# Patient Record
Sex: Female | Born: 1985 | Hispanic: No | Marital: Married | Smoking: Never smoker
Health system: Southern US, Community
[De-identification: ages and names within clinical notes are randomized; demographics above are authoritative.]

## PROBLEM LIST (undated history)

## (undated) DIAGNOSIS — J45909 Unspecified asthma, uncomplicated: Secondary | ICD-10-CM

## (undated) HISTORY — PX: NO PAST SURGERIES: SHX2092

---

## 2015-06-05 ENCOUNTER — Encounter: Payer: Self-pay | Admitting: Gynecology

## 2015-06-05 ENCOUNTER — Ambulatory Visit: Payer: Self-pay

## 2015-06-05 ENCOUNTER — Ambulatory Visit
Admission: EM | Admit: 2015-06-05 | Discharge: 2015-06-05 | Disposition: A | Discharge status: Home / Self Care | Payer: Self-pay | Attending: Family Medicine | Admitting: Family Medicine

## 2015-06-05 DIAGNOSIS — J209 Acute bronchitis, unspecified: Secondary | ICD-10-CM

## 2015-06-05 HISTORY — DX: Unspecified asthma, uncomplicated: J45.909

## 2015-06-05 MED ORDER — IPRATROPIUM-ALBUTEROL 0.5-2.5 (3) MG/3ML IN SOLN
3.0000 mL | Freq: Once | RESPIRATORY_TRACT | Status: AC
  Administered 2015-06-05: 3 mL via RESPIRATORY_TRACT

## 2015-06-05 MED ORDER — METHYLPREDNISOLONE SODIUM SUCC 125 MG IJ SOLR
125.0000 mg | Freq: Once | INTRAMUSCULAR | Status: AC
  Administered 2015-06-05: 125 mg via INTRAMUSCULAR

## 2015-06-05 MED ORDER — PREDNISONE 10 MG (21) PO TBPK: ORAL_TABLET | ORAL | Status: DC

## 2015-06-05 NOTE — Discharge Instructions (Signed)
Acute Bronchitis Bronchitis is when the airways that extend from the windpipe into the lungs get red, puffy, and painful (inflamed). Bronchitis often causes thick spit (mucus) to develop. This leads to a cough. A cough is the most common symptom of bronchitis. In acute bronchitis, the condition usually begins suddenly and goes away over time (usually in 2 weeks). Smoking, allergies, and asthma can make bronchitis worse. Repeated episodes of bronchitis may cause more lung problems. HOME CARE  Rest.  Drink enough fluids to keep your pee (urine) clear or pale yellow (unless you need to limit fluids as told by your doctor).  Only take over-the-counter or prescription medicines as told by your doctor.  Avoid smoking and secondhand smoke. These can make bronchitis worse. If you are a smoker, think about using nicotine gum or skin patches. Quitting smoking will help your lungs heal faster.  Reduce the chance of getting bronchitis again by:  Washing your hands often.  Avoiding people with cold symptoms.  Trying not to touch your hands to your mouth, nose, or eyes.  Follow up with your doctor as told. GET HELP IF: Your symptoms do not improve after 1 week of treatment. Symptoms include:  Cough.  Fever.  Coughing up thick spit.  Body aches.  Chest congestion.  Chills.  Shortness of breath.  Sore throat. GET HELP RIGHT AWAY IF:   You have an increased fever.  You have chills.  You have severe shortness of breath.  You have bloody thick spit (sputum).  You throw up (vomit) often.  You lose too much body fluid (dehydration).  You have a severe headache.  You faint. MAKE SURE YOU:   Understand these instructions.  Will watch your condition.  Will get help right away if you are not doing well or get worse.   This information is not intended to replace advice given to you by your health care provider. Make sure you discuss any questions you have with your health care  provider.   Document Released: 01/31/2008 Document Revised: 04/16/2013 Document Reviewed: 02/04/2013 Elsevier Interactive Patient Education 2016 [employer] Bronchospasm, Adult A bronchospasm is when the tubes that carry air in and out of your lungs (airways) spasm or tighten. During a bronchospasm it is hard to breathe. This is because the airways get smaller. A bronchospasm can be triggered by:  Allergies. These may be to animals, pollen, food, or mold.  Infection. This is a common cause of bronchospasm.  Exercise.  Irritants. These include pollution, cigarette smoke, strong odors, aerosol sprays, and paint fumes.  Weather changes.  Stress.  Being emotional. HOME CARE   Always have a plan for getting help. Know when to call your doctor and local emergency services (911 in the [place] Know where you can get emergency care.  Only take medicines as told by your doctor.  If you were prescribed an inhaler or nebulizer machine, ask your doctor how to use it correctly. Always use a spacer with your inhaler if you were given one.  Stay calm during an attack. Try to relax and breathe more slowly.  Control your home environment:  Change your heating and air conditioning filter at least once a month.  Limit your use of fireplaces and wood stoves.  Do not  smoke. Do not  allow smoking in your home.  Avoid perfumes and fragrances.  Get rid of pests (such as roaches and mice) and their droppings.  Throw away plants if you see mold on them.  Keep  your house clean and dust free.  Replace carpet with wood, tile, or vinyl flooring. Carpet can trap dander and dust.  Use allergy-proof pillows, mattress covers, and box spring covers.  Wash bed sheets and blankets every week in hot water. Dry them in a dryer.  Use blankets that are made of polyester or cotton.  Wash hands frequently. GET HELP IF:  You have muscle aches.  You have chest pain.  The thick spit you spit or  cough up (sputum) changes from clear or white to yellow, green, gray, or bloody.  The thick spit you spit or cough up gets thicker.  There are problems that may be related to the medicine you are given such as:  A rash.  Itching.  Swelling.  Trouble breathing. GET HELP RIGHT AWAY IF:  You feel you cannot breathe or catch your breath.  You cannot stop coughing.  Your treatment is not helping you breathe better.  You have very bad chest pain. MAKE SURE YOU:   Understand these instructions.  Will watch your condition.  Will get help right away if you are not doing well or get worse.   This information is not intended to replace advice given to you by your health care provider. Make sure you discuss any questions you have with your health care provider.   Document Released: 06/11/2009 Document Revised: 09/04/2014 Document Reviewed: 02/04/2013 Elsevier Interactive Patient Education [employer]

## 2015-06-05 NOTE — ED Notes (Signed)
Patient stated that her Proventil that she is taking on and off for 10 yrs for her asthmam is not working. Pt. Stated x 2 weeks ago with  Cold symptom. However when she take her Proventil it does not help.

## 2015-06-05 NOTE — ED Provider Notes (Addendum)
CSN: [id number]     Arrival date & time 06/05/15  0827 History   First MD Initiated Contact with Patient 06/05/15 [phone]     Chief Complaint  Patient presents with  . Asthma   (Consider location/radiation/quality/duration/timing/severity/associated sxs/prior Treatment) Patient is a 29 y.o. female presenting with asthma. The history is provided by the patient.  Asthma This is a recurrent problem. The current episode started more than 1 week ago. The problem has been gradually worsening. Associated symptoms include shortness of breath. Pertinent negatives include no chest pain, no abdominal pain and no headaches. Nothing relieves the symptoms. Treatments tried: Albuterol inhaler.    Past Medical History  Diagnosis Date  . Asthma    History reviewed. No pertinent past surgical history. History reviewed. No pertinent family history. Social History  Substance Use Topics  . Smoking status: Never Smoker   . Smokeless tobacco: None  . Alcohol Use: No   OB History    No data available     Review of Systems  Respiratory: Positive for cough, chest tightness, shortness of breath and wheezing.   Cardiovascular: Negative for chest pain.  Gastrointestinal: Negative for abdominal pain.  Neurological: Negative for headaches.  All other systems reviewed and are negative.   Allergies  Review of patient's allergies indicates no known allergies.  Home Medications   Prior to Admission medications   Medication Sig Start Date End Date Taking? Authorizing Provider  albuterol (PROVENTIL HFA;VENTOLIN HFA) 108 (90 BASE) MCG/ACT inhaler Inhale into the lungs every 6 (six) hours as needed for wheezing or shortness of breath.   Yes Historical Provider, MD  predniSONE (STERAPRED UNI-PAK 21 TAB) 10 MG (21) TBPK tablet Sig 6 tablet day 1, 5 tablets day 2, 4 tablets day 3,,3tablets day 4, 2 tablets day 5, 1 tablet day 6 take all tablets orally 06/05/15   Hassan Rowan, MD   Meds Ordered and Administered this  Visit   Medications  ipratropium-albuterol (DUONEB) 0.5-2.5 (3) MG/3ML nebulizer solution 3 mL (3 mLs Nebulization Given 06/05/15 0926)  methylPREDNISolone sodium succinate (SOLU-MEDROL) 125 mg/2 mL injection 125 mg (125 mg Intramuscular Given 06/05/15 0943)    BP 119/67 mmHg  Pulse 69  Temp(Src) 98.2 F (36.8 C) (Oral)  Resp 18  Ht  (1.549 m)  Wt 189 lb (85.73 kg)  BMI 35.73 kg/m2  SpO2 100%  LMP 05/29/2015 No data found.   Physical Exam  Constitutional: She is oriented to person, place, and time. She appears well-developed and well-nourished.  HENT:  Head: Normocephalic and atraumatic.  Eyes: Pupils are equal, round, and reactive to light.  Neck: Normal range of motion. Neck supple.  Cardiovascular: Normal rate, regular rhythm and normal heart sounds.   Pulmonary/Chest: Effort normal and breath sounds normal. No respiratory distress. She has no wheezes.  Musculoskeletal: Normal range of motion. She exhibits no edema.  Neurological: She is alert and oriented to person, place, and time.  Skin: Skin is warm. She is not diaphoretic.  Psychiatric: She has a normal mood and affect.  Vitals reviewed.   ED Course  Procedures (including critical care time)  Labs Review Labs Reviewed - No data to display  Imaging Review Dg Chest 2 View  06/05/2015   CLINICAL DATA:  Shortness of breath.  EXAM: CHEST  2 VIEW  COMPARISON:  None.  FINDINGS: The heart size and mediastinal contours are within normal limits. Both lungs are clear. No pneumothorax or pleural effusion is noted. The visualized skeletal structures are unremarkable.  IMPRESSION: No active cardiopulmonary disease.   Electronically Signed   By: Lupita Raider, M.D.   On: 06/05/2015 09:58     Visual Acuity Review  Right Eye Distance:   Left Eye Distance:   Bilateral Distance:    Right Eye Near:   Left Eye Near:    Bilateral Near:         MDM   1. Bronchitis, acute, with bronchospasm      Patient given  DuoNeb treatment. One time considered Advair Diskus but she has no insurance. We'll have her continue using Proventil inhaler place on 6 day course of prednisone work note for the next 2 days and return if symptoms not better. At this time she denies ever and cough is not productive hold off on anabiotic's.  After patient was given DuoNeb treatment she still felt short of breath. She is now agreed to have the Solu-Medrol injection and we proceeded with a chest x-ray which was negative. By the time chest x-ray was read patient was feeling better discharge home on oral prednisone for 6 days and the Proventil inhaler she's had before. Workup was also given to her as well and patient was feeling much better.    Hassan Rowan, MD 06/05/15 6045  Hassan Rowan, MD 06/05/15 1142  Hassan Rowan, MD 06/05/15 [phone]

## 2019-03-21 ENCOUNTER — Ambulatory Visit
Admission: EM | Admit: 2019-03-21 | Discharge: 2019-03-21 | Disposition: A | Discharge status: Home / Self Care | Payer: Self-pay | Attending: Family Medicine | Admitting: Family Medicine

## 2019-03-21 ENCOUNTER — Other Ambulatory Visit: Payer: Self-pay

## 2019-03-21 DIAGNOSIS — L255 Unspecified contact dermatitis due to plants, except food: Secondary | ICD-10-CM

## 2019-03-21 MED ORDER — PREDNISONE 20 MG PO TABS: ORAL_TABLET | ORAL | 0 refills | Status: DC

## 2019-03-21 MED ORDER — HYDROCORTISONE 2.5 % EX CREA: TOPICAL_CREAM | Freq: Two times a day (BID) | CUTANEOUS | 0 refills | Status: DC

## 2019-03-21 NOTE — ED Provider Notes (Signed)
[facility]    CSN: [id number] Arrival date & time: 03/21/19  1532     History   Chief Complaint Chief Complaint  Patient presents with  . Rash    HPI Melissa Ballard is a 33 y.o. female.   33 yo female with a c/o rash to her left lower leg which has now spread to her right forearm for the past 4 days. States rash developed after she had been working out in her garden pulling weeds. States she doesn't know for sure if she was exposed to poison ivy or poison oak. States rash is very itchy. Denies any fevers, chills.    Rash   Past Medical History:  Diagnosis Date  . Asthma     There are no active problems to display for this patient.   Past Surgical History:  Procedure Laterality Date  . NO PAST SURGERIES      OB History   No obstetric history on file.      Home Medications    Prior to Admission medications   Medication Sig Start Date End Date Taking? Authorizing Provider  Phentermine HCl 15 MG TBDP Take by mouth.   Yes [provider]  albuterol (PROVENTIL HFA;VENTOLIN HFA) 108 (90 BASE) MCG/ACT inhaler Inhale into the lungs every 6 (six) hours as needed for wheezing or shortness of breath.    [provider]  hydrocortisone 2.5 % cream Apply topically 2 (two) times daily. 03/21/19   Norval Gable, MD  predniSONE (DELTASONE) 20 MG tablet 3 tabs po once day 1, then 2 tabs po qd x 2 days, then 1 tab po qd x 2 days, then half a tab po qd x 2 days 03/21/19   Norval Gable, MD    Family History History reviewed. No pertinent family history.  Social History Social History   Tobacco Use  . Smoking status: Never Smoker  . Smokeless tobacco: Never Used  Substance Use Topics  . Alcohol use: No  . Drug use: No     Allergies   Patient has no known allergies.   Review of Systems Review of Systems  Skin: Positive for rash.     Physical Exam Triage Vital Signs ED Triage Vitals  Enc Vitals Group     BP 03/21/19 1546 (!)  112/53     Pulse Rate 03/21/19 1546 60     Resp 03/21/19 1546 18     Temp 03/21/19 1546 98.7 F (37.1 C)     Temp Source 03/21/19 1546 Oral     SpO2 03/21/19 1546 100 %     Weight 03/21/19 1544 170 lb (77.1 kg)     Height 03/21/19 1544 5\' 2"  (1.575 m)     Head Circumference --      Peak Flow --      Pain Score 03/21/19 1544 0     Pain Loc --      Pain Edu? --      Excl. in [place]? --    No data found.  Updated Vital Signs BP (!) 112/53 (BP Location: Right Arm)   Pulse 60   Temp 98.7 F (37.1 C) (Oral)   Resp 18   Ht 5\' 2"  (1.575 m)   Wt 77.1 kg   LMP 03/17/2019   SpO2 100%   BMI 31.09 kg/m   Visual Acuity Right Eye Distance:   Left Eye Distance:   Bilateral Distance:    Right Eye Near:   Left Eye Near:  Bilateral Near:     Physical Exam Vitals signs and nursing note reviewed.  Constitutional:      General: She is not in acute distress.    Appearance: She is not toxic-appearing or diaphoretic.  Skin:    Findings: Rash (erythematous, vesicular rash on left lower leg; and right forearm ) present.  Neurological:     Mental Status: She is alert.      UC Treatments / Results  Labs (all labs ordered are listed, but only abnormal results are displayed) Labs Reviewed - No data to display  EKG   Radiology No results found.  Procedures Procedures (including critical care time)  Medications Ordered in UC Medications - No data to display  Initial Impression / Assessment and Plan / UC Course  I have reviewed the triage vital signs and the nursing notes.  Pertinent labs & imaging results that were available during my care of the patient were reviewed by me and considered in my medical decision making (see chart for details).      Final Clinical Impressions(s) / UC Diagnoses   Final diagnoses:  Contact dermatitis due to plant    ED Prescriptions    Medication Sig Dispense Auth. Provider   predniSONE (DELTASONE) 20 MG tablet 3 tabs po once day 1,  then 2 tabs po qd x 2 days, then 1 tab po qd x 2 days, then half a tab po qd x 2 days 10 tablet , Pamala Hurryrlando, MD   hydrocortisone 2.5 % cream Apply topically 2 (two) times daily. 30 g Payton Mccallumonty, , MD      1. diagnosis reviewed with patient 2. rx as per orders above; reviewed possible side effects, interactions, risks and benefits  3. Recommend supportive treatment with otc benadryl prn itching 4. Follow-up prn if symptoms worsen or don't improve   Controlled Substance Prescriptions [place] Controlled Substance Registry consulted? Not Applicable   Payton Mccallumonty, , MD 03/21/19 [phone]

## 2019-03-21 NOTE — ED Triage Notes (Signed)
Patient complains of rash to lower right leg and has spread to arms. Patient states that she noticed this on Monday after gardening, reports that this is very itchy.

## 2019-04-09 ENCOUNTER — Other Ambulatory Visit: Payer: Self-pay

## 2019-04-09 DIAGNOSIS — Z20822 Contact with and (suspected) exposure to covid-19: Secondary | ICD-10-CM

## 2019-04-11 LAB — NOVEL CORONAVIRUS, NAA: SARS-CoV-2, NAA: DETECTED — AB

## 2021-02-14 ENCOUNTER — Ambulatory Visit (INDEPENDENT_AMBULATORY_CARE_PROVIDER_SITE_OTHER): Payer: Self-pay

## 2021-02-14 ENCOUNTER — Encounter: Payer: Self-pay | Admitting: Internal Medicine

## 2021-02-14 ENCOUNTER — Ambulatory Visit
Admission: EM | Admit: 2021-02-14 | Discharge: 2021-02-14 | Disposition: A | Discharge status: Home / Self Care | Payer: Self-pay | Attending: Emergency Medicine | Admitting: Emergency Medicine

## 2021-02-14 ENCOUNTER — Other Ambulatory Visit: Payer: Self-pay

## 2021-02-14 DIAGNOSIS — Z20822 Contact with and (suspected) exposure to covid-19: Secondary | ICD-10-CM | POA: Insufficient documentation

## 2021-02-14 DIAGNOSIS — R059 Cough, unspecified: Secondary | ICD-10-CM

## 2021-02-14 DIAGNOSIS — J069 Acute upper respiratory infection, unspecified: Secondary | ICD-10-CM

## 2021-02-14 DIAGNOSIS — R0602 Shortness of breath: Secondary | ICD-10-CM

## 2021-02-14 DIAGNOSIS — J4541 Moderate persistent asthma with (acute) exacerbation: Secondary | ICD-10-CM

## 2021-02-14 LAB — GROUP A STREP BY PCR: Group A Strep by PCR: NOT DETECTED

## 2021-02-14 MED ORDER — DEXAMETHASONE SODIUM PHOSPHATE 10 MG/ML IJ SOLN: 10.0000 mg | Freq: Once | INTRAMUSCULAR | Status: DC

## 2021-02-14 MED ORDER — DEXAMETHASONE SODIUM PHOSPHATE 10 MG/ML IJ SOLN
10.0000 mg | Freq: Once | INTRAMUSCULAR | Status: AC
  Administered 2021-02-14: 10 mg via INTRAMUSCULAR

## 2021-02-14 MED ORDER — PREDNISONE 20 MG PO TABS: ORAL_TABLET | ORAL | 0 refills | Status: DC

## 2021-02-14 MED ORDER — BENZONATATE 200 MG PO CAPS: 200.0000 mg | ORAL_CAPSULE | Freq: Two times a day (BID) | ORAL | 0 refills | Status: DC | PRN

## 2021-02-14 NOTE — ED Provider Notes (Signed)
[facility]    CSN: [id number] Arrival date & time: 02/14/21  1433      History   Chief Complaint Chief Complaint  Patient presents with   Cough    HPI Melissa Ballard is a 35 y.o. female who presents with onset of ST, cough, and nose congestion since yesterday. Had sweats and chills 2 nights ago and felt feverish. Has been feeling SOB. Has been using her inhaler but is not helping.  Denies traveling 8 or more hours by plane or car.     Past Medical History:  Diagnosis Date   Asthma     There are no problems to display for this patient.   Past Surgical History:  Procedure Laterality Date   NO PAST SURGERIES      OB History   No obstetric history on file.      Home Medications    Prior to Admission medications   Medication Sig Start Date End Date Taking? Authorizing Provider  albuterol (PROVENTIL HFA;VENTOLIN HFA) 108 (90 BASE) MCG/ACT inhaler Inhale into the lungs every 6 (six) hours as needed for wheezing or shortness of breath.   Yes [provider]  benzonatate (TESSALON) 200 MG capsule Take 1 capsule (200 mg total) by mouth 2 (two) times daily as needed for cough. 02/14/21  Yes Rodriguez-Southworth, Nettie Elm, PA-C  predniSONE (DELTASONE) 20 MG tablet 3 tabs po once day 1, then 2 tabs po qd x 2 days, then 1 tab po qd x 2 days, then half a tab po qd x 2 days 02/14/21   Rodriguez-Southworth, Nettie Elm, PA-C    Family History History reviewed. No pertinent family history.  Social History Social History   Tobacco Use   Smoking status: Never   Smokeless tobacco: Never  Vaping Use   Vaping Use: Never used  Substance Use Topics   Alcohol use: No   Drug use: No     Allergies   Patient has no known allergies.   Review of Systems Review of Systems  Constitutional:  Positive for chills and diaphoresis. Negative for appetite change and fever.  HENT:  Positive for sore throat. Negative for congestion.   Eyes:  Negative for discharge.   Respiratory:  Positive for cough, chest tightness and shortness of breath.   Gastrointestinal:  Positive for diarrhea. Negative for nausea and vomiting.  Musculoskeletal:  Positive for myalgias.  Skin:  Negative for rash.  Neurological:  Positive for headaches.    Physical Exam Triage Vital Signs ED Triage Vitals [02/14/21 1451]  Enc Vitals Group     BP 127/69     Pulse Rate 75     Resp 18     Temp 98.5 F (36.9 C)     Temp Source Oral     SpO2 100 %     Weight 187 lb (84.8 kg)     Height 5\' 2"  (1.575 m)     Head Circumference      Peak Flow      Pain Score 0     Pain Loc      Pain Edu?      Excl. in [place]?    No data found.  Updated Vital Signs BP 127/69 (BP Location: Right Arm)   Pulse 75   Temp 98.5 F (36.9 C) (Oral)   Resp 18   Ht 5\' 2"  (1.575 m)   Wt 187 lb (84.8 kg)   LMP 01/24/2021   SpO2 100%   BMI 34.20 kg/m  Visual Acuity Right Eye Distance:   Left Eye Distance:   Bilateral Distance:    Right Eye Near:   Left Eye Near:    Bilateral Near:     Physical Exam Physical Exam Constitutional:      General: He is not in acute distress.    Appearance: He is not toxic-appearing.  HENT:     Head: Normocephalic.     Right Ear: Tympanic membrane, ear canal and external ear normal.     Left Ear: Ear canal and external ear normal.     Nose: Nose normal.     Mouth/Throat:     Mouth: Mucous membranes are moist.     Pharynx: Oropharynx is clear.  Eyes:     General: No scleral icterus.    Conjunctiva/sclera: Conjunctivae normal.  Cardiovascular:     Rate and Rhythm: Normal rate and regular rhythm.     Heart sounds: No murmur heard.   Pulmonary:     Effort: Pulmonary effort is normal. No respiratory distress.     Breath sounds: Wheezing present.    Musculoskeletal:        General: Normal range of motion.     Cervical back: Neck supple.  Lymphadenopathy:     Cervical: No cervical adenopathy.  Skin:    General: Skin is warm and dry.     Findings:  No rash.  Neurological:     Mental Status: He is alert and oriented to person, place, and time.     Gait: Gait normal.  Psychiatric:        Mood and Affect: Mood normal.        Behavior: Behavior normal.        Thought Content: Thought content normal.        Judgment: Judgment normal.    UC Treatments / Results  Labs (all labs ordered are listed, but only abnormal results are displayed) Labs Reviewed  GROUP A STREP BY PCR  SARS CORONAVIRUS 2 (TAT 6-24 HRS)  Strep test is neg   EKG   Radiology DG Chest 2 View  Result Date: 02/14/2021 CLINICAL DATA:  Shortness of breath and cough for 1 day. EXAM: CHEST - 2 VIEW COMPARISON:  06/05/2015 FINDINGS: The cardiomediastinal silhouette is unremarkable. There is no evidence of focal airspace disease, pulmonary edema, suspicious pulmonary nodule/mass, pleural effusion, or pneumothorax. No acute bony abnormalities are identified. IMPRESSION: No active cardiopulmonary disease. Electronically Signed   By: Harmon Pier M.D.   On: 02/14/2021 16:51    Procedures Procedures (including critical care time)  Medications Ordered in UC Medications  dexamethasone (DECADRON) injection 10 mg (10 mg Intramuscular Given 02/14/21 1558)    Initial Impression / Assessment and Plan / UC Course  I have reviewed the triage vital signs and the nursing notes. Pertinent labs & imaging results that were available during my care of the patient were reviewed by me and considered in my medical decision making (see chart for details). Covid test is pending. Has URI with Asthma exacerbation. She was given Decadrol 10 mg IM and she did not feel it helped her much.  Her CXR is normal. I placed her on Tessalon and prednisone pack. See instructions.   Final Clinical Impressions(s) / UC Diagnoses   Final diagnoses:  Cough  Upper respiratory tract infection, unspecified type  Moderate persistent asthma with acute exacerbation     Discharge Instructions      Stay  quarantined until the covid test comes back.  ED Prescriptions     Medication Sig Dispense Auth. Provider   predniSONE (DELTASONE) 20 MG tablet 3 tabs po once day 1, then 2 tabs po qd x 2 days, then 1 tab po qd x 2 days, then half a tab po qd x 2 days 10 tablet Rodriguez-Southworth, Nettie Elm, PA-C   benzonatate (TESSALON) 200 MG capsule Take 1 capsule (200 mg total) by mouth 2 (two) times daily as needed for cough. 30 capsule Rodriguez-Southworth, Nettie Elm, PA-C      PDMP not reviewed this encounter.   Garey Ham, Cordelia Poche 02/14/21 2043

## 2021-02-14 NOTE — Discharge Instructions (Addendum)
Stay quarantined until the covid test comes back.

## 2021-02-14 NOTE — ED Triage Notes (Signed)
Patient complains of sore throat, cough, shortness of breath and nasal congestion since yesterday.

## 2021-02-15 LAB — SARS CORONAVIRUS 2 (TAT 6-24 HRS): SARS Coronavirus 2: NEGATIVE

## 2021-05-02 ENCOUNTER — Ambulatory Visit
Admission: EM | Admit: 2021-05-02 | Discharge: 2021-05-02 | Disposition: A | Discharge status: Home / Self Care | Payer: Self-pay | Attending: Family Medicine | Admitting: Family Medicine

## 2021-05-02 ENCOUNTER — Other Ambulatory Visit: Payer: Self-pay

## 2021-05-02 ENCOUNTER — Encounter: Payer: Self-pay | Admitting: Emergency Medicine

## 2021-05-02 DIAGNOSIS — H00014 Hordeolum externum left upper eyelid: Secondary | ICD-10-CM

## 2021-05-02 MED ORDER — POLYMYXIN B-TRIMETHOPRIM 10000-0.1 UNIT/ML-% OP SOLN: 1.0000 [drp] | Freq: Four times a day (QID) | OPHTHALMIC | 0 refills | Status: AC

## 2021-05-02 NOTE — ED Provider Notes (Signed)
[facility]    CSN: [id number] Arrival date & time: 05/02/21  1205      History   Chief Complaint Chief Complaint  Patient presents with   Eye Pain    HPI  35 year old female presents with the above complaint.  Patient reports that she had some pain of her left eye approximate 4 days ago.  Separately noted swelling of the left upper eyelid.  No injury.  She does note some crusting/matting in the a.m.  Her discomfort is 5/10 in severity.  No relieving factors.  No other associated symptoms.  Past Medical History:  Diagnosis Date   Asthma    Past Surgical History:  Procedure Laterality Date   NO PAST SURGERIES      OB History   No obstetric history on file.      Home Medications    Prior to Admission medications   Medication Sig Start Date End Date Taking? Authorizing Provider  trimethoprim-polymyxin b (POLYTRIM) ophthalmic solution Place 1 drop into the left eye every 6 (six) hours for 7 days. 05/02/21 05/09/21 Yes ,  G, DO  albuterol (PROVENTIL HFA;VENTOLIN HFA) 108 (90 BASE) MCG/ACT inhaler Inhale into the lungs every 6 (six) hours as needed for wheezing or shortness of breath.    [provider]  phentermine (ADIPEX-P) 37.5 MG tablet Take 37.5 mg by mouth daily. 03/22/21   [provider]    Social History Social History   Tobacco Use   Smoking status: Never   Smokeless tobacco: Never  Vaping Use   Vaping Use: Never used  Substance Use Topics   Alcohol use: No   Drug use: No     Allergies   Patient has no known allergies.   Review of Systems Review of Systems  Constitutional: Negative.   Eyes:        Swelling of the left upper eyelid. Crusting in a.m.   Physical Exam Triage Vital Signs ED Triage Vitals  Enc Vitals Group     BP 05/02/21 1232 129/80     Pulse Rate 05/02/21 1232 76     Resp 05/02/21 1232 18     Temp 05/02/21 1232 98.2 F (36.8 C)     Temp Source 05/02/21 1232 Oral     SpO2 05/02/21 1232  98 %     Weight --      Height --      Head Circumference --      Peak Flow --      Pain Score 05/02/21 1228 5     Pain Loc --      Pain Edu? --      Excl. in GC? --    Updated Vital Signs BP 129/80 (BP Location: Left Arm)   Pulse 76   Temp 98.2 F (36.8 C) (Oral)   Resp 18   LMP 04/18/2021   SpO2 98%   Visual Acuity Right Eye Distance:   Left Eye Distance:   Bilateral Distance:    Right Eye Near:   Left Eye Near:    Bilateral Near:     Physical Exam Vitals and nursing note reviewed.  Constitutional:      General: She is not in acute distress.    Appearance: Normal appearance. She is not ill-appearing.  HENT:     Head: Normocephalic and atraumatic.  Eyes:     General:        Right eye: No discharge.        Left eye: No  discharge.     Conjunctiva/sclera: Conjunctivae normal.     Comments: Swelling of the left upper eyelid.  Erythema of the left upper eyelid as well.  Pulmonary:     Effort: Pulmonary effort is normal. No respiratory distress.  Neurological:     Mental Status: She is alert.  Psychiatric:        Mood and Affect: Mood normal.        Behavior: Behavior normal.   UC Treatments / Results  Labs (all labs ordered are listed, but only abnormal results are displayed) Labs Reviewed - No data to display  EKG   Radiology No results found.  Procedures Procedures (including critical care time)  Medications Ordered in UC Medications - No data to display  Initial Impression / Assessment and Plan / UC Course  I have reviewed the triage vital signs and the nursing notes.  Pertinent labs & imaging results that were available during my care of the patient were reviewed by me and considered in my medical decision making (see chart for details).    35 year old female presents with a stye advised warm compresses.  Polytrim as directed.  Supportive care.  Final Clinical Impressions(s) / UC Diagnoses   Final diagnoses:  Hordeolum externum of left  upper eyelid     Discharge Instructions      Warm compresses.  Medication as prescribed.  Take care  Dr. Adriana Simas    ED Prescriptions     Medication Sig Dispense Auth. Provider   trimethoprim-polymyxin b (POLYTRIM) ophthalmic solution Place 1 drop into the left eye every 6 (six) hours for 7 days. 10 mL Tommie Sams, DO      PDMP not reviewed this encounter.   Tommie Sams, [place] 05/02/21 1252

## 2021-05-02 NOTE — Discharge Instructions (Signed)
Warm compresses. ° °Medication as prescribed. ° °Take care ° °Dr.   °

## 2021-05-02 NOTE — ED Triage Notes (Signed)
Pt presents today with redness/swelling to left upper lid x 4 days. Denies fever or injury.

## 2021-12-03 ENCOUNTER — Encounter: Payer: Self-pay | Admitting: Emergency Medicine

## 2021-12-03 ENCOUNTER — Ambulatory Visit
Admission: EM | Admit: 2021-12-03 | Discharge: 2021-12-03 | Disposition: A | Discharge status: Home / Self Care | Payer: Self-pay | Attending: Emergency Medicine | Admitting: Emergency Medicine

## 2021-12-03 ENCOUNTER — Other Ambulatory Visit: Payer: Self-pay

## 2021-12-03 DIAGNOSIS — N3001 Acute cystitis with hematuria: Secondary | ICD-10-CM | POA: Insufficient documentation

## 2021-12-03 LAB — URINALYSIS, ROUTINE W REFLEX MICROSCOPIC
Bilirubin Urine: NEGATIVE
Glucose, UA: NEGATIVE mg/dL
Ketones, ur: NEGATIVE mg/dL
Nitrite: NEGATIVE
Protein, ur: NEGATIVE mg/dL
Specific Gravity, Urine: 1.015 (ref 1.005–1.030)
pH: 7 (ref 5.0–8.0)

## 2021-12-03 LAB — URINALYSIS, MICROSCOPIC (REFLEX)

## 2021-12-03 MED ORDER — PHENAZOPYRIDINE HCL 97.2 MG PO TABS: 97.0000 mg | ORAL_TABLET | Freq: Three times a day (TID) | ORAL | 0 refills | Status: DC | PRN

## 2021-12-03 MED ORDER — SULFAMETHOXAZOLE-TRIMETHOPRIM 800-160 MG PO TABS: 1.0000 | ORAL_TABLET | Freq: Two times a day (BID) | ORAL | 0 refills | Status: AC

## 2021-12-03 NOTE — Discharge Instructions (Addendum)
-  Start the antibiotic-Bactrim (trimethoprim-sulfamethoxazole) twice daily x7 days. Take with food if you have a sensitive stomach. ?-Azo (phenazopyridine) as needed up to 3x daily for urinary pain ?-Follow-up if symptoms worsen instead of improve- abd pain, back pain, new fevers, etc. ?

## 2021-12-03 NOTE — ED Triage Notes (Signed)
Patient c/o burning when urinating and urinary frequency and right sided lower back pain that started yesterday.  Patient denies fevers but reports chills.  ?

## 2021-12-03 NOTE — ED Provider Notes (Signed)
?[facility] ? ? ? ?CSN: [id number] ?Arrival date & time: 12/03/21  0954 ? ? ?  ? ?History   ?Chief Complaint ?Chief Complaint  ?Patient presents with  ? Dysuria  ? ? ?HPI ?Melissa Ballard is a 36 y.o. female presenting with urinary symptoms for 1 day.  History UTI, states last 1 was greater than 2 years ago.  Describes dysuria, urinary frequency, suprapubic pressure, right flank pain.  Subjective chills, but denies fevers.  Denies gross hematuria, external vaginal irritation, vaginal discharge, vaginal rash or lesion, n/v/d/c. LMP 11/21/21, States she is not pregnant or breastfeeding. ? ? ?HPI ? ?Past Medical History:  ?Diagnosis Date  ? Asthma   ? ? ?There are no problems to display for this patient. ? ? ?Past Surgical History:  ?Procedure Laterality Date  ? NO PAST SURGERIES    ? ? ?OB History   ?No obstetric history on file. ?  ? ? ? ?Home Medications   ? ?Prior to Admission medications   ?Medication Sig Start Date End Date Taking? Authorizing Provider  ?phenazopyridine (PYRIDIUM) 97 MG tablet Take 1 tablet (97 mg total) by mouth 3 (three) times daily as needed for pain. 12/03/21  Yes Rhys MartiniGraham,  E, PA-C  ?sulfamethoxazole-trimethoprim (BACTRIM DS) 800-160 MG tablet Take 1 tablet by mouth 2 (two) times daily for 7 days. 12/03/21 12/10/21 Yes Rhys MartiniGraham,  E, PA-C  ?albuterol (PROVENTIL HFA;VENTOLIN HFA) 108 (90 BASE) MCG/ACT inhaler Inhale into the lungs every 6 (six) hours as needed for wheezing or shortness of breath.    [provider]  ? ? ?Family History ?History reviewed. No pertinent family history. ? ?Social History ?Social History  ? ?Tobacco Use  ? Smoking status: Never  ? Smokeless tobacco: Never  ?Vaping Use  ? Vaping Use: Never used  ?Substance Use Topics  ? Alcohol use: No  ? Drug use: No  ? ? ? ?Allergies   ?Patient has no known allergies. ? ? ?Review of Systems ?Review of Systems  ?Constitutional:  Positive for chills. Negative for appetite change, diaphoresis and fever.   ?Respiratory:  Negative for shortness of breath.   ?Cardiovascular:  Negative for chest pain.  ?Gastrointestinal:  Negative for abdominal pain, blood in stool, constipation, diarrhea, nausea and vomiting.  ?Genitourinary:  Positive for flank pain and frequency. Negative for decreased urine volume, difficulty urinating, dysuria, genital sores, hematuria and urgency.  ?Musculoskeletal:  Negative for back pain.  ?Neurological:  Negative for dizziness, weakness and light-headedness.  ?All other systems reviewed and are negative. ? ? ?Physical Exam ?Triage Vital Signs ?ED Triage Vitals  ?Enc Vitals Group  ?   BP 12/03/21 1010 118/76  ?   Pulse Rate 12/03/21 1010 66  ?   Resp 12/03/21 1010 14  ?   Temp 12/03/21 1010 98.2 ?F (36.8 ?C)  ?   Temp Source 12/03/21 1010 Oral  ?   SpO2 12/03/21 1010 100 %  ?   Weight 12/03/21 1007 190 lb (86.2 kg)  ?   Height 12/03/21 1007 5\' 2"  (1.575 m)  ?   Head Circumference --   ?   Peak Flow --   ?   Pain Score 12/03/21 1007 7  ?   Pain Loc --   ?   Pain Edu? --   ?   Excl. in GC? --   ? ?No data found. ? ?Updated Vital Signs ?BP 118/76 (BP Location: Right Arm)   Pulse 66   Temp 98.2 ?F (36.8 ?C) (Oral)  Resp 14   Ht 5\' 2"  (1.575 m)   Wt 190 lb (86.2 kg)   LMP 11/21/2021 (Approximate)   SpO2 100%   BMI 34.75 kg/m?  ? ?Visual Acuity ?Right Eye Distance:   ?Left Eye Distance:   ?Bilateral Distance:   ? ?Right Eye Near:   ?Left Eye Near:    ?Bilateral Near:    ? ?Physical Exam ?Vitals reviewed.  ?Constitutional:   ?   General: She is not in acute distress. ?   Appearance: Normal appearance. She is not ill-appearing.  ?HENT:  ?   Head: Normocephalic and atraumatic.  ?   Mouth/Throat:  ?   Mouth: Mucous membranes are moist.  ?   Comments: Moist mucous membranes ?Eyes:  ?   Extraocular Movements: Extraocular movements intact.  ?   Pupils: Pupils are equal, round, and reactive to light.  ?Cardiovascular:  ?   Rate and Rhythm: Normal rate and regular rhythm.  ?   Heart sounds: Normal  heart sounds.  ?Pulmonary:  ?   Effort: Pulmonary effort is normal.  ?   Breath sounds: Normal breath sounds. No wheezing, rhonchi or rales.  ?Abdominal:  ?   General: Bowel sounds are normal. There is no distension.  ?   Palpations: Abdomen is soft. There is no mass.  ?   Tenderness: There is abdominal tenderness in the suprapubic area. There is right CVA tenderness. There is no left CVA tenderness, guarding or rebound. Negative signs include Murphy's sign, Rovsing's sign and McBurney's sign.  ?   Comments: Minimal CVAT on exam. Comfortable throughout exam.   ?Skin: ?   General: Skin is warm.  ?   Capillary Refill: Capillary refill takes less than 2 seconds.  ?   Comments: Good skin turgor  ?Neurological:  ?   General: No focal deficit present.  ?   Mental Status: She is alert and oriented to person, place, and time.  ?Psychiatric:     ?   Mood and Affect: Mood normal.     ?   Behavior: Behavior normal.  ? ? ? ?UC Treatments / Results  ?Labs ?(all labs ordered are listed, but only abnormal results are displayed) ?Labs Reviewed  ?URINALYSIS, ROUTINE W REFLEX MICROSCOPIC - Abnormal; Notable for the following components:  ?    Result Value  ? Hgb urine dipstick MODERATE (*)   ? Leukocytes,Ua SMALL (*)   ? All other components within normal limits  ?URINALYSIS, MICROSCOPIC (REFLEX) - Abnormal; Notable for the following components:  ? Bacteria, UA FEW (*)   ? All other components within normal limits  ?URINE CULTURE  ? ? ?EKG ? ? ?Radiology ?No results found. ? ?Procedures ?Procedures (including critical care time) ? ?Medications Ordered in UC ?Medications - No data to display ? ?Initial Impression / Assessment and Plan / UC Course  ?I have reviewed the triage vital signs and the nursing notes. ? ?Pertinent labs & imaging results that were available during my care of the patient were reviewed by me and considered in my medical decision making (see chart for details). ? ?  ? ?This patient is a very pleasant 36 y.o. year  old female presenting with acute cystitis with hematuria. There is mild R CVAT on exam; I do have concern for early pyelo. Afebrile, nontachy, nontoxic appearing. Tolerating fluids and food by mouth. ? ?UA with moderate blood, small leuk. Culture sent.  ? ?Will cover for pyelo with bactrim as below. Azo for symptomatic relief . ? ?  ED return precautions discussed. Patient verbalizes understanding and agreement.  ? ?Coding Level 4 for acute illness with systemic symptoms, and prescription drug management ?  ? ?Final Clinical Impressions(s) / UC Diagnoses  ? ?Final diagnoses:  ?Acute cystitis with hematuria  ? ? ? ?Discharge Instructions   ? ?  ?-Start the antibiotic-Bactrim (trimethoprim-sulfamethoxazole) twice daily x7 days. Take with food if you have a sensitive stomach. ?-Azo (phenazopyridine) as needed up to 3x daily for urinary pain ?-Follow-up if symptoms worsen instead of improve- abd pain, back pain, new fevers, etc. ? ? ?ED Prescriptions   ? ? Medication Sig Dispense Auth. Provider  ? phenazopyridine (PYRIDIUM) 97 MG tablet Take 1 tablet (97 mg total) by mouth 3 (three) times daily as needed for pain. 10 tablet Rhys Martini, PA-C  ? sulfamethoxazole-trimethoprim (BACTRIM DS) 800-160 MG tablet Take 1 tablet by mouth 2 (two) times daily for 7 days. 14 tablet Rhys Martini, PA-C  ? ?  ? ?PDMP not reviewed this encounter. ?  ?Rhys Martini, PA-C ?12/03/21 1053 ? ?

## 2021-12-05 LAB — URINE CULTURE

## 2022-05-01 ENCOUNTER — Ambulatory Visit
Admission: EM | Admit: 2022-05-01 | Discharge: 2022-05-01 | Disposition: A | Discharge status: Home / Self Care | Payer: Self-pay | Attending: Physician Assistant | Admitting: Physician Assistant

## 2022-05-01 ENCOUNTER — Encounter: Payer: Self-pay | Admitting: Physician Assistant

## 2022-05-01 DIAGNOSIS — B353 Tinea pedis: Secondary | ICD-10-CM

## 2022-05-01 DIAGNOSIS — J4531 Mild persistent asthma with (acute) exacerbation: Secondary | ICD-10-CM

## 2022-05-01 MED ORDER — PREDNISONE 10 MG (21) PO TBPK: ORAL_TABLET | ORAL | 0 refills | Status: DC

## 2022-05-01 MED ORDER — IPRATROPIUM-ALBUTEROL 0.5-2.5 (3) MG/3ML IN SOLN
3.0000 mL | Freq: Once | RESPIRATORY_TRACT | Status: AC
  Administered 2022-05-01: 3 mL via RESPIRATORY_TRACT

## 2022-05-01 MED ORDER — METHYLPREDNISOLONE SODIUM SUCC 40 MG IJ SOLR: 80.0000 mg | Freq: Once | INTRAMUSCULAR | Status: DC

## 2022-05-01 MED ORDER — CICLOPIROX OLAMINE 0.77 % EX CREA: TOPICAL_CREAM | Freq: Two times a day (BID) | CUTANEOUS | 0 refills | Status: DC

## 2022-05-01 MED ORDER — METHYLPREDNISOLONE SODIUM SUCC 125 MG IJ SOLR
125.0000 mg | Freq: Once | INTRAMUSCULAR | Status: AC
  Administered 2022-05-01: 125 mg via INTRAMUSCULAR

## 2022-05-01 NOTE — ED Provider Notes (Signed)
[facility]    CSN: [id number] Arrival date & time: 05/01/22  1205      History   Chief Complaint Chief Complaint  Patient presents with   Cough    HPI Melissa Ballard is a 36 y.o. female.   Patient presents today with a 2-week history of cough.  She reports associated shortness of breath and wheezing.  She does have a history of asthma and has been using her albuterol inhaler which has provided only temporary relief of symptoms.  She denies any fever, chest pain, nausea, vomiting, significant congestion.  She does have allergies and has been taking medication as prescribed.  She does report history of hospitalization related to asthma several years ago but did not require intubation.  She denies any recent antibiotic or steroid use.  Denies history of diabetes and has no concern for pregnancy.  She is having difficulty with her daily activities as result of the cough and shortness of breath prompting evaluation today.  In addition, patient reports a month-long history of pruritic rash on her left foot.  She has tried topical antifungal medications with only temporary improvement of symptoms.  This has provided some improvement but she continues to have pruritus and lesion has not completely healed.  She denies any changes to personal hygiene products including soaps or detergents.  Denies any spread of rash.    Past Medical History:  Diagnosis Date   Asthma     There are no problems to display for this patient.   Past Surgical History:  Procedure Laterality Date   NO PAST SURGERIES      OB History   No obstetric history on file.      Home Medications    Prior to Admission medications   Medication Sig Start Date End Date Taking? Authorizing Provider  albuterol (PROVENTIL HFA;VENTOLIN HFA) 108 (90 BASE) MCG/ACT inhaler Inhale into the lungs every 6 (six) hours as needed for wheezing or shortness of breath.   Yes [provider]  ciclopirox (LOPROX)  0.77 % cream Apply topically 2 (two) times daily. 05/01/22  Yes ,  K, PA-C  predniSONE (STERAPRED UNI-PAK 21 TAB) 10 MG (21) TBPK tablet As directed 05/01/22  Yes , Derry Skill, PA-C    Family History History reviewed. No pertinent family history.  Social History Social History   Tobacco Use   Smoking status: Never   Smokeless tobacco: Never  Vaping Use   Vaping Use: Never used  Substance Use Topics   Alcohol use: No   Drug use: No     Allergies   Patient has no known allergies.   Review of Systems Review of Systems  Constitutional:  Positive for activity change. Negative for appetite change, fatigue and fever.  HENT:  Negative for congestion and sore throat.   Respiratory:  Positive for cough, chest tightness and shortness of breath.   Cardiovascular:  Negative for chest pain.  Gastrointestinal:  Negative for abdominal pain, diarrhea, nausea and vomiting.  Skin:  Positive for rash.  Neurological:  Negative for dizziness, light-headedness and headaches.     Physical Exam Triage Vital Signs ED Triage Vitals  Enc Vitals Group     BP 05/01/22 1307 119/81     Pulse Rate 05/01/22 1307 66     Resp 05/01/22 1307 18     Temp 05/01/22 1307 98.3 F (36.8 C)     Temp Source 05/01/22 1307 Oral     SpO2 05/01/22 1307 99 %  Weight 05/01/22 1306 187 lb (84.8 kg)     Height 05/01/22 1306 5\' 1"  (1.549 m)     Head Circumference --      Peak Flow --      Pain Score --      Pain Loc --      Pain Edu? --      Excl. in GC? --    No data found.  Updated Vital Signs BP 119/81 (BP Location: Left Arm)   Pulse 66   Temp 98.3 F (36.8 C) (Oral)   Resp 18   Ht 5\' 1"  (1.549 m)   Wt 187 lb (84.8 kg)   LMP 04/10/2022   SpO2 99%   BMI 35.33 kg/m   Visual Acuity Right Eye Distance:   Left Eye Distance:   Bilateral Distance:    Right Eye Near:   Left Eye Near:    Bilateral Near:     Physical Exam Vitals reviewed.  Constitutional:      General: She is awake.  She is not in acute distress.    Appearance: Normal appearance. She is well-developed. She is not ill-appearing.     Comments: Very pleasant female appears stated age in no acute distress sitting comfortably in exam room  HENT:     Head: Normocephalic and atraumatic.     Right Ear: Tympanic membrane, ear canal and external ear normal. Tympanic membrane is not erythematous or bulging.     Left Ear: Tympanic membrane, ear canal and external ear normal. Tympanic membrane is not erythematous or bulging.     Nose:     Right Sinus: No maxillary sinus tenderness or frontal sinus tenderness.     Left Sinus: No maxillary sinus tenderness or frontal sinus tenderness.     Mouth/Throat:     Pharynx: Uvula midline. No oropharyngeal exudate or posterior oropharyngeal erythema.  Cardiovascular:     Rate and Rhythm: Normal rate and regular rhythm.     Heart sounds: Normal heart sounds, S1 normal and S2 normal. No murmur heard. Pulmonary:     Effort: Pulmonary effort is normal.     Breath sounds: Wheezing present. No rhonchi or rales.     Comments: Scattered wheezing.  Reactive cough with deep breathing Skin:    Findings: Rash present. Rash is scaling.     Comments: Maculopapular rash with scaling noted medial arch left foot.  Psychiatric:        Behavior: Behavior is cooperative.      UC Treatments / Results  Labs (all labs ordered are listed, but only abnormal results are displayed) Labs Reviewed - No data to display  EKG   Radiology No results found.  Procedures Procedures (including critical care time)  Medications Ordered in UC Medications  ipratropium-albuterol (DUONEB) 0.5-2.5 (3) MG/3ML nebulizer solution 3 mL (3 mLs Nebulization Given 05/01/22 1337)  methylPREDNISolone sodium succinate (SOLU-MEDROL) 125 mg/2 mL injection 125 mg (125 mg Intramuscular Given 05/01/22 1333)    Initial Impression / Assessment and Plan / UC Course  I have reviewed the triage vital signs and the nursing  notes.  Pertinent labs & imaging results that were available during my care of the patient were reviewed by me and considered in my medical decision making (see chart for details).     Patient is well-appearing, afebrile, nontoxic, nontachycardic.  No evidence of acute infection on physical exam that would warrant initiation of antibiotics.  Patient has been symptomatic for several weeks so we will defer viral testing  as this would not change management.  Concern for asthma exacerbation given clinical presentation.  Patient was given DuoNeb and 125 Solu-Medrol in clinic with improvement of symptoms.  We will start prednisone taper tomorrow (05/02/2022).  She was instructed to avoid NSAIDs with this medication due to risk of GI bleeding.  Can use Tylenol, Mucinex, Flonase for additional symptom relief.  She is to continue her albuterol inhaler every 4-6 hours as needed.  Unfortunately, she is self-pay so we will defer initiation of maintenance medication for the time being in the hopes that this flare will resolve with steroid use.  Discussed that if her symptoms persist or worsen anyway she is to return for reevaluation.  Strict return precautions given.  Symptoms consistent with tinea pedis.  She has tried multiple over-the-counter medications without improvement of symptoms.  She was prescribed Loprox.  Discussed that she should keep the area clean and apply this twice daily for minimum of 2 weeks.  If she has any persistent or worsening symptoms she is to return for reevaluation.  Final Clinical Impressions(s) / UC Diagnoses   Final diagnoses:  Mild persistent asthma with acute exacerbation  Tinea pedis of left foot     Discharge Instructions      We gave the injection of steroids today.  Starting tomorrow (05/02/2022) start prednisone.  Do not take NSAIDs including aspirin, ibuprofen/Advil, naproxen/Aleve with this medication.  Continue using your albuterol inhaler every 4-6 hours as needed.  If  your symptoms are improving quickly with this medication regimen or if at any point anything worsens you need to go to the emergency room.  I have called in a prescription for athlete's foot medication.  Use this twice daily for minimum of 2 weeks.  Make sure that you are keeping your feet clean and dry.  Wash your socks and hot water and dry them on high heat.  Spray disinfected in your shoes.  If anything worsens return for reevaluation.     ED Prescriptions     Medication Sig Dispense Auth. Provider   ciclopirox (LOPROX) 0.77 % cream Apply topically 2 (two) times daily. 15 g ,  K, PA-C   predniSONE (STERAPRED UNI-PAK 21 TAB) 10 MG (21) TBPK tablet As directed 21 tablet ,  K, PA-C      PDMP not reviewed this encounter.   Jeani Hawking, PA-C 05/01/22 1345

## 2022-05-01 NOTE — ED Triage Notes (Signed)
Pt here with C/O cough for almost 2 weeks, states she is becoming SOB, has been using albuterol inhaler with little to no relief. States it just doesn't feel like normal breathing. Denies any other SX

## 2022-05-01 NOTE — Discharge Instructions (Signed)
We gave the injection of steroids today.  Starting tomorrow (05/02/2022) start prednisone.  Do not take NSAIDs including aspirin, ibuprofen/Advil, naproxen/Aleve with this medication.  Continue using your albuterol inhaler every 4-6 hours as needed.  If your symptoms are improving quickly with this medication regimen or if at any point anything worsens you need to go to the emergency room.  I have called in a prescription for athlete's foot medication.  Use this twice daily for minimum of 2 weeks.  Make sure that you are keeping your feet clean and dry.  Wash your socks and hot water and dry them on high heat.  Spray disinfected in your shoes.  If anything worsens return for reevaluation.

## 2022-06-03 ENCOUNTER — Ambulatory Visit
Admission: EM | Admit: 2022-06-03 | Discharge: 2022-06-03 | Disposition: A | Discharge status: Home / Self Care | Payer: Self-pay | Attending: Family Medicine | Admitting: Family Medicine

## 2022-06-03 ENCOUNTER — Encounter: Payer: Self-pay | Admitting: Emergency Medicine

## 2022-06-03 DIAGNOSIS — B9689 Other specified bacterial agents as the cause of diseases classified elsewhere: Secondary | ICD-10-CM

## 2022-06-03 DIAGNOSIS — B3731 Acute candidiasis of vulva and vagina: Secondary | ICD-10-CM

## 2022-06-03 DIAGNOSIS — N760 Acute vaginitis: Secondary | ICD-10-CM | POA: Insufficient documentation

## 2022-06-03 DIAGNOSIS — N3000 Acute cystitis without hematuria: Secondary | ICD-10-CM

## 2022-06-03 LAB — URINALYSIS, ROUTINE W REFLEX MICROSCOPIC
Bilirubin Urine: NEGATIVE
Glucose, UA: 100 mg/dL — AB
Ketones, ur: NEGATIVE mg/dL
Leukocytes,Ua: NEGATIVE
Nitrite: POSITIVE — AB
Specific Gravity, Urine: <1.005 — ABNORMAL LOW (ref 1.005–1.030)
pH: 5 (ref 5.0–8.0)

## 2022-06-03 LAB — URINALYSIS, MICROSCOPIC (REFLEX)

## 2022-06-03 LAB — WET PREP, GENITAL
Sperm: NONE SEEN
Trich, Wet Prep: NONE SEEN
WBC, Wet Prep HPF POC: >10 — AB (ref <10)

## 2022-06-03 MED ORDER — METRONIDAZOLE 500 MG PO TABS: 500.0000 mg | ORAL_TABLET | Freq: Two times a day (BID) | ORAL | 0 refills | Status: DC

## 2022-06-03 MED ORDER — CEPHALEXIN 500 MG PO CAPS: 500.0000 mg | ORAL_CAPSULE | Freq: Four times a day (QID) | ORAL | 0 refills | Status: DC

## 2022-06-03 MED ORDER — FLUCONAZOLE 150 MG PO TABS: 150.0000 mg | ORAL_TABLET | ORAL | 0 refills | Status: AC

## 2022-06-03 NOTE — ED Provider Notes (Signed)
[facility]    CSN: [id number] Arrival date & time: 06/03/22  1245      History   Chief Complaint Chief Complaint  Patient presents with   Dysuria     HPI HPI Melissa Ballard is a 36 y.o. female.     Dysuria: Pain urinating started 2 days ago after waking up. Patient reports urinary frequency and urgency.  Reports, no blood in urine. She has tried AZO for this with some relief. Reports antibiotics in the last 30 days. Has had 2 UTIs in the last 12 months. Patient reports  exposure to an STI. Patient does not think she is  pregnant as she has a copper IUD placed in 2018. She does not use condoms regularly with her partner.   Patient reports no CVA tenderness, n fevers, no vaginal discharge, or mouth ulcers.   - Missed period:  Patient's last menstrual period was 05/09/2022 (approximate). - Fever: no - Abdominal pain lower  - Pelvic pain: yes  - Vaginal bleeding: no - Pain during sex: no - Rash: no - Sore throat: no   - Arthralgias: no - Nausea: no - Vomiting: no - Dysuria: yes  - Back Pain: no  - Headache: no       Past Medical History:  Diagnosis Date   Asthma     There are no problems to display for this patient.   Past Surgical History:  Procedure Laterality Date   NO PAST SURGERIES      OB History   No obstetric history on file.      Home Medications    Prior to Admission medications   Medication Sig Start Date End Date Taking? Authorizing Provider  cephALEXin (KEFLEX) 500 MG capsule Take 1 capsule (500 mg total) by mouth 4 (four) times daily. 06/03/22  Yes , , DO  fluconazole (DIFLUCAN) 150 MG tablet Take 1 tablet (150 mg total) by mouth every 3 (three) days for 2 doses. 06/06/22 06/10/22 Yes , , DO  metroNIDAZOLE (FLAGYL) 500 MG tablet Take 1 tablet (500 mg total) by mouth 2 (two) times daily. 06/03/22  Yes , , DO  albuterol (PROVENTIL HFA;VENTOLIN HFA) 108 (90 BASE) MCG/ACT inhaler Inhale  into the lungs every 6 (six) hours as needed for wheezing or shortness of breath.    [provider]  ciclopirox (LOPROX) 0.77 % cream Apply topically 2 (two) times daily. 05/01/22   Raspet, Derry Skill, PA-C    Family History History reviewed. No pertinent family history.  Social History Social History   Tobacco Use   Smoking status: Never   Smokeless tobacco: Never  Vaping Use   Vaping Use: Never used  Substance Use Topics   Alcohol use: No   Drug use: No     Allergies   Patient has no known allergies.   Review of Systems Review of Systems: negative unless otherwise stated in HPI.      Physical Exam Triage Vital Signs ED Triage Vitals  Enc Vitals Group     BP 06/03/22 1253 120/82     Pulse Rate 06/03/22 1253 68     Resp 06/03/22 1253 14     Temp 06/03/22 1253 98.3 F (36.8 C)     Temp Source 06/03/22 1253 Oral     SpO2 06/03/22 1253 96 %     Weight 06/03/22 1251 186 lb 15.2 oz (84.8 kg)     Height 06/03/22 1251 5\' 1"  (1.549 m)     Head Circumference --  Peak Flow --      Pain Score 06/03/22 1250 8     Pain Loc --      Pain Edu? --      Excl. in [place]? --    No data found.  Updated Vital Signs BP 120/82 (BP Location: Left Arm)   Pulse 68   Temp 98.3 F (36.8 C) (Oral)   Resp 14   Ht 5\' 1"  (1.549 m)   Wt 84.8 kg   LMP 05/09/2022 (Approximate)   SpO2 96%   BMI 35.32 kg/m   Visual Acuity Right Eye Distance:   Left Eye Distance:   Bilateral Distance:    Right Eye Near:   Left Eye Near:    Bilateral Near:     Physical Exam GEN: well appearing female in no acute distress  CVS: well perfused  RESP: speaking in full sentences without pause  ABD: soft, suprapubic tenderness, non-distended, no palpable masses, no CVA tenderness   GU: deferred, patient performed self swab    UC Treatments / Results  Labs (all labs ordered are listed, but only abnormal results are displayed) Labs Reviewed  WET PREP, GENITAL - Abnormal; Notable for the  following components:      Result Value   Yeast Wet Prep HPF POC PRESENT (*)    Clue Cells Wet Prep HPF POC PRESENT (*)    WBC, Wet Prep HPF POC >10 (*)    All other components within normal limits  URINALYSIS, ROUTINE W REFLEX MICROSCOPIC - Abnormal; Notable for the following components:   Color, Urine AMBER (*)    Specific Gravity, Urine <1.005 (*)    Glucose, UA 100 (*)    Hgb urine dipstick TRACE (*)    Protein, ur TRACE (*)    Nitrite POSITIVE (*)    All other components within normal limits  URINALYSIS, MICROSCOPIC (REFLEX) - Abnormal; Notable for the following components:   Bacteria, UA MANY (*)    All other components within normal limits  URINE CULTURE    EKG   Radiology No results found.  Procedures Procedures (including critical care time)  Medications Ordered in UC Medications - No data to display  Initial Impression / Assessment and Plan / UC Course  I have reviewed the triage vital signs and the nursing notes.  Pertinent labs & imaging results that were available during my care of the patient were reviewed by me and considered in my medical decision making (see chart for details).     Patient is a 36 y.o. female  who presents for 2 days of dysuria and vaginal discharge. Overall patient is well-appearing and afebrile.  Vital signs stable.  UA consistent with acute cystitis.  Hematuria not  supported on microscopy.  Patient has 2 UTIs in the past year. Treat with  Keflex 4 times daily for 5 days.   Urine culture obtained.  Follow-up sensitivities and change antibiotics, if needed.   Wet prep consistent with both bacterial vaginosis and yeast vaginitis. Treat with Flagyl 500 BID x 7 days and Diflucan in staggered doses.  Advised patient to not drink alcohol while taking Flagyl. She declined STI testing. Return precautions including abdominal pain, fever, chills, nausea, or vomiting given.   Discussed MDM, treatment plan and plan for follow-up with patient/parent  who agrees with plan.    Final Clinical Impressions(s) / UC Diagnoses   Final diagnoses:  Acute cystitis without hematuria  Bacterial vaginosis  Yeast vaginitis     Discharge Instructions  Stop by the pharmacy to pick up your prescriptions.  Follow up with your primary care provider as needed.  For yeast: start taking on Tuesday then take the second dose 3 days later   For bacterial vaginosis: take twice a day for 7 days   For urinary tract infection (UTI): take antibiotics 4 times a day. Someone will call you if we need to change your antibiotics   Go to ED for red flag symptoms, including; fevers you cannot reduce with Tylenol/Motrin, severe headaches, vision changes, numbness/weakness in part of the body, lethargy, confusion, intractable vomiting, severe dehydration, chest pain, breathing difficulty, severe persistent abdominal or pelvic pain, signs of severe infection (increased redness, swelling of an area), feeling faint or passing out, dizziness, etc. You should especially go to the ED for sudden acute worsening of condition if you do not elect to go at this time.       ED Prescriptions     Medication Sig Dispense Auth. Provider   fluconazole (DIFLUCAN) 150 MG tablet Take 1 tablet (150 mg total) by mouth every 3 (three) days for 2 doses. 2 tablet , , DO   metroNIDAZOLE (FLAGYL) 500 MG tablet Take 1 tablet (500 mg total) by mouth 2 (two) times daily. 14 tablet , , DO   cephALEXin (KEFLEX) 500 MG capsule Take 1 capsule (500 mg total) by mouth 4 (four) times daily. 20 capsule Lyndee Hensen, DO      PDMP not reviewed this encounter.   Lyndee Hensen, DO 06/03/22 1342

## 2022-06-03 NOTE — Discharge Instructions (Addendum)
Stop by the pharmacy to pick up your prescriptions.  Follow up with your primary care provider as needed.  For yeast: start taking on Tuesday then take the second dose 3 days later   For bacterial vaginosis: take twice a day for 7 days   For urinary tract infection (UTI): take antibiotics 4 times a day. Someone will call you if we need to change your antibiotics   Go to ED for red flag symptoms, including; fevers you cannot reduce with Tylenol/Motrin, severe headaches, vision changes, numbness/weakness in part of the body, lethargy, confusion, intractable vomiting, severe dehydration, chest pain, breathing difficulty, severe persistent abdominal or pelvic pain, signs of severe infection (increased redness, swelling of an area), feeling faint or passing out, dizziness, etc. You should especially go to the ED for sudden acute worsening of condition if you do not elect to go at this time.

## 2022-06-03 NOTE — ED Triage Notes (Signed)
Patient c/o dysuria and urinary frequency that started yesterday.  Patient unsure of vaginal discharge.

## 2022-06-05 LAB — URINE CULTURE: Culture: <10000 — AB

## 2022-09-30 ENCOUNTER — Ambulatory Visit
Admission: EM | Admit: 2022-09-30 | Discharge: 2022-09-30 | Disposition: A | Discharge status: Home / Self Care | Payer: 59 | Attending: Physician Assistant | Admitting: Physician Assistant

## 2022-09-30 ENCOUNTER — Encounter: Payer: Self-pay | Admitting: Emergency Medicine

## 2022-09-30 DIAGNOSIS — R051 Acute cough: Secondary | ICD-10-CM | POA: Diagnosis not present

## 2022-09-30 DIAGNOSIS — U071 COVID-19: Secondary | ICD-10-CM | POA: Diagnosis not present

## 2022-09-30 DIAGNOSIS — J45901 Unspecified asthma with (acute) exacerbation: Secondary | ICD-10-CM | POA: Insufficient documentation

## 2022-09-30 LAB — RESP PANEL BY RT-PCR (RSV, FLU A&B, COVID)  RVPGX2
Influenza A by PCR: NEGATIVE
Influenza B by PCR: NEGATIVE
Resp Syncytial Virus by PCR: NEGATIVE
SARS Coronavirus 2 by RT PCR: POSITIVE — AB

## 2022-09-30 LAB — GROUP A STREP BY PCR: Group A Strep by PCR: NOT DETECTED

## 2022-09-30 MED ORDER — PREDNISONE 20 MG PO TABS: 40.0000 mg | ORAL_TABLET | Freq: Every day | ORAL | 0 refills | Status: AC

## 2022-09-30 MED ORDER — MOLNUPIRAVIR 200 MG PO CAPS: 4.0000 | ORAL_CAPSULE | Freq: Two times a day (BID) | ORAL | 0 refills | Status: AC

## 2022-09-30 MED ORDER — PROMETHAZINE-DM 6.25-15 MG/5ML PO SYRP: 5.0000 mL | ORAL_SOLUTION | Freq: Four times a day (QID) | ORAL | 0 refills | Status: DC | PRN

## 2022-09-30 NOTE — ED Triage Notes (Signed)
Patient c/o sore throat, cough, and chest congestion that started on Thursday.  Patient denies fevers.

## 2022-09-30 NOTE — ED Provider Notes (Signed)
[facility]    CSN: [id number] Arrival date & time: 09/30/22  1159      History   Chief Complaint Chief Complaint  Patient presents with   Sore Throat   Cough    HPI Melissa Ballard is a 37 y.o. female presenting for fatigue, cough, congestion, sore throat, headache and bodyaches that began 2 days ago.  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  She also reports feeling short of breath.  Denies fever.  Has been taking over-the-counter cough medication without relief.  Has also been using her inhaler which does help.  She has history of asthma.  She is not reporting any vomiting or diarrhea.  No other complaints or concerns.  HPI  Past Medical History:  Diagnosis Date   Asthma     There are no problems to display for this patient.   Past Surgical History:  Procedure Laterality Date   NO PAST SURGERIES      OB History   No obstetric history on file.      Home Medications    Prior to Admission medications   Medication Sig Start Date End Date Taking? Authorizing Provider  molnupiravir EUA (LAGEVRIO) 200 MG CAPS capsule Take 4 capsules (800 mg total) by mouth 2 (two) times daily for 5 days. 09/30/22 10/05/22 Yes Danton Clap, PA-C  predniSONE (DELTASONE) 20 MG tablet Take 2 tablets (40 mg total) by mouth daily for 5 days. 09/30/22 10/05/22 Yes Danton Clap, PA-C  promethazine-dextromethorphan (PROMETHAZINE-DM) 6.25-15 MG/5ML syrup Take 5 mLs by mouth 4 (four) times daily as needed. 09/30/22  Yes Laurene Footman B, PA-C  albuterol (PROVENTIL HFA;VENTOLIN HFA) 108 (90 BASE) MCG/ACT inhaler Inhale into the lungs every 6 (six) hours as needed for wheezing or shortness of breath.    [provider]    Family History History reviewed. No pertinent family history.  Social History Social History   Tobacco Use   Smoking status: Never   Smokeless tobacco: Never  Vaping Use   Vaping Use: Never used  Substance Use Topics    Alcohol use: No   Drug use: No     Allergies   Patient has no known allergies.   Review of Systems Review of Systems  Constitutional:  Positive for fatigue. Negative for chills, diaphoresis and fever.  HENT:  Positive for congestion, rhinorrhea and sore throat. Negative for ear pain, sinus pressure and sinus pain.   Respiratory:  Positive for cough, shortness of breath and wheezing.   Cardiovascular:  Negative for chest pain.  Gastrointestinal:  Negative for abdominal pain, nausea and vomiting.  Musculoskeletal:  Positive for myalgias.  Skin:  Negative for rash.  Neurological:  Positive for headaches. Negative for weakness.  Hematological:  Negative for adenopathy.     Physical Exam Triage Vital Signs ED Triage Vitals  Enc Vitals Group     BP      Pulse      Resp      Temp      Temp src      SpO2      Weight      Height      Head Circumference      Peak Flow      Pain Score      Pain Loc      Pain Edu?      Excl. in [place]?    No data found.  Updated Vital Signs BP (!) 147/69 (  BP Location: Left Arm)   Pulse 82   Temp 98.4 F (36.9 C) (Oral)   Resp 15   Ht 5\' 1"  (1.549 m)   Wt 190 lb (86.2 kg)   LMP 09/16/2022 (Approximate)   SpO2 100%   BMI 35.90 kg/m     Physical Exam Vitals and nursing note reviewed.  Constitutional:      General: She is not in acute distress.    Appearance: Normal appearance. She is well-developed. She is ill-appearing. She is not toxic-appearing.  HENT:     Head: Normocephalic and atraumatic.     Nose: Congestion present.     Mouth/Throat:     Mouth: Mucous membranes are moist.     Pharynx: Oropharynx is clear. Posterior oropharyngeal erythema present.  Eyes:     General: No scleral icterus.       Right eye: No discharge.        Left eye: No discharge.     Conjunctiva/sclera: Conjunctivae normal.  Cardiovascular:     Rate and Rhythm: Normal rate and regular rhythm.     Heart sounds: Normal heart sounds.  Pulmonary:      Effort: Pulmonary effort is normal. No respiratory distress.     Breath sounds: Wheezing (few wheezes bilateral upper lung fields) present.  Musculoskeletal:     Cervical back: Neck supple.  Skin:    General: Skin is dry.  Neurological:     General: No focal deficit present.     Mental Status: She is alert. Mental status is at baseline.     Motor: No weakness.     Gait: Gait normal.  Psychiatric:        Mood and Affect: Mood normal.        Behavior: Behavior normal.        Thought Content: Thought content normal.      UC Treatments / Results  Labs (all labs ordered are listed, but only abnormal results are displayed) Labs Reviewed  RESP PANEL BY RT-PCR (RSV, FLU A&B, COVID)  RVPGX2 - Abnormal; Notable for the following components:      Result Value   SARS Coronavirus 2 by RT PCR POSITIVE (*)    All other components within normal limits  GROUP A STREP BY PCR    EKG   Radiology No results found.  Procedures Procedures (including critical care time)  Medications Ordered in UC Medications - No data to display  Initial Impression / Assessment and Plan / UC Course  I have reviewed the triage vital signs and the nursing notes.  Pertinent labs & imaging results that were available during my care of the patient were reviewed by me and considered in my medical decision making (see chart for details).   37 year old female with history of asthma presents for cough, congestion, sore throat, headache, fatigue, body aches and shortness of breath for the past few days.  She is afebrile.  Mildly ill-appearing but nontoxic.  Exam as nasal congestion, erythema posterior pharynx and few scattered wheezes throughout bilateral upper lung fields.  PCR strep negative.  Respiratory panel positive for COVID-19.  Discussed results patient.  Since she has asthma she is a candidate for antiviral medication.  Sent molnupiravir to pharmacy.  Reviewed current CDC guidelines, isolation protocol and  ED precautions.  Encouraged increasing rest and fluids.  Sent prednisone and Promethazine DM to pharmacy as well.  Work note provided.  Follow-up as needed.   Final Clinical Impressions(s) / UC Diagnoses   Final  diagnoses:  COVID-19  Acute cough  Asthma with acute exacerbation, unspecified asthma severity, unspecified whether persistent     Discharge Instructions      -You have COVID. -Isolate until 2/7 and then wear a mask x 5 days -I have sent antiviral medicine, prednisone and cough medicine.  -Return if uncontrollable fever, weakness or increased  breathing difficulty.     ED Prescriptions     Medication Sig Dispense Auth. Provider   molnupiravir EUA (LAGEVRIO) 200 MG CAPS capsule Take 4 capsules (800 mg total) by mouth 2 (two) times daily for 5 days. 40 capsule Danton Clap, PA-C   promethazine-dextromethorphan (PROMETHAZINE-DM) 6.25-15 MG/5ML syrup Take 5 mLs by mouth 4 (four) times daily as needed. 118 mL Laurene Footman B, PA-C   predniSONE (DELTASONE) 20 MG tablet Take 2 tablets (40 mg total) by mouth daily for 5 days. 10 tablet Gretta Cool      PDMP not reviewed this encounter.   Danton Clap, PA-C 09/30/22 1357

## 2022-09-30 NOTE — Discharge Instructions (Addendum)
-  You have COVID. -Isolate until 2/7 and then wear a mask x 5 days -I have sent antiviral medicine, prednisone and cough medicine.  -Return if uncontrollable fever, weakness or increased  breathing difficulty.

## 2022-10-19 DIAGNOSIS — Z1389 Encounter for screening for other disorder: Secondary | ICD-10-CM | POA: Diagnosis not present

## 2022-10-19 DIAGNOSIS — R300 Dysuria: Secondary | ICD-10-CM | POA: Diagnosis not present

## 2022-12-07 IMAGING — CR DG CHEST 2V
2 series · 2 of 2 positions shown · non-contrast
Comparison: 06/05/2015

CLINICAL DATA: Shortness of breath and cough for 1 day.

EXAM:
CHEST - 2 VIEW

[chest pa]
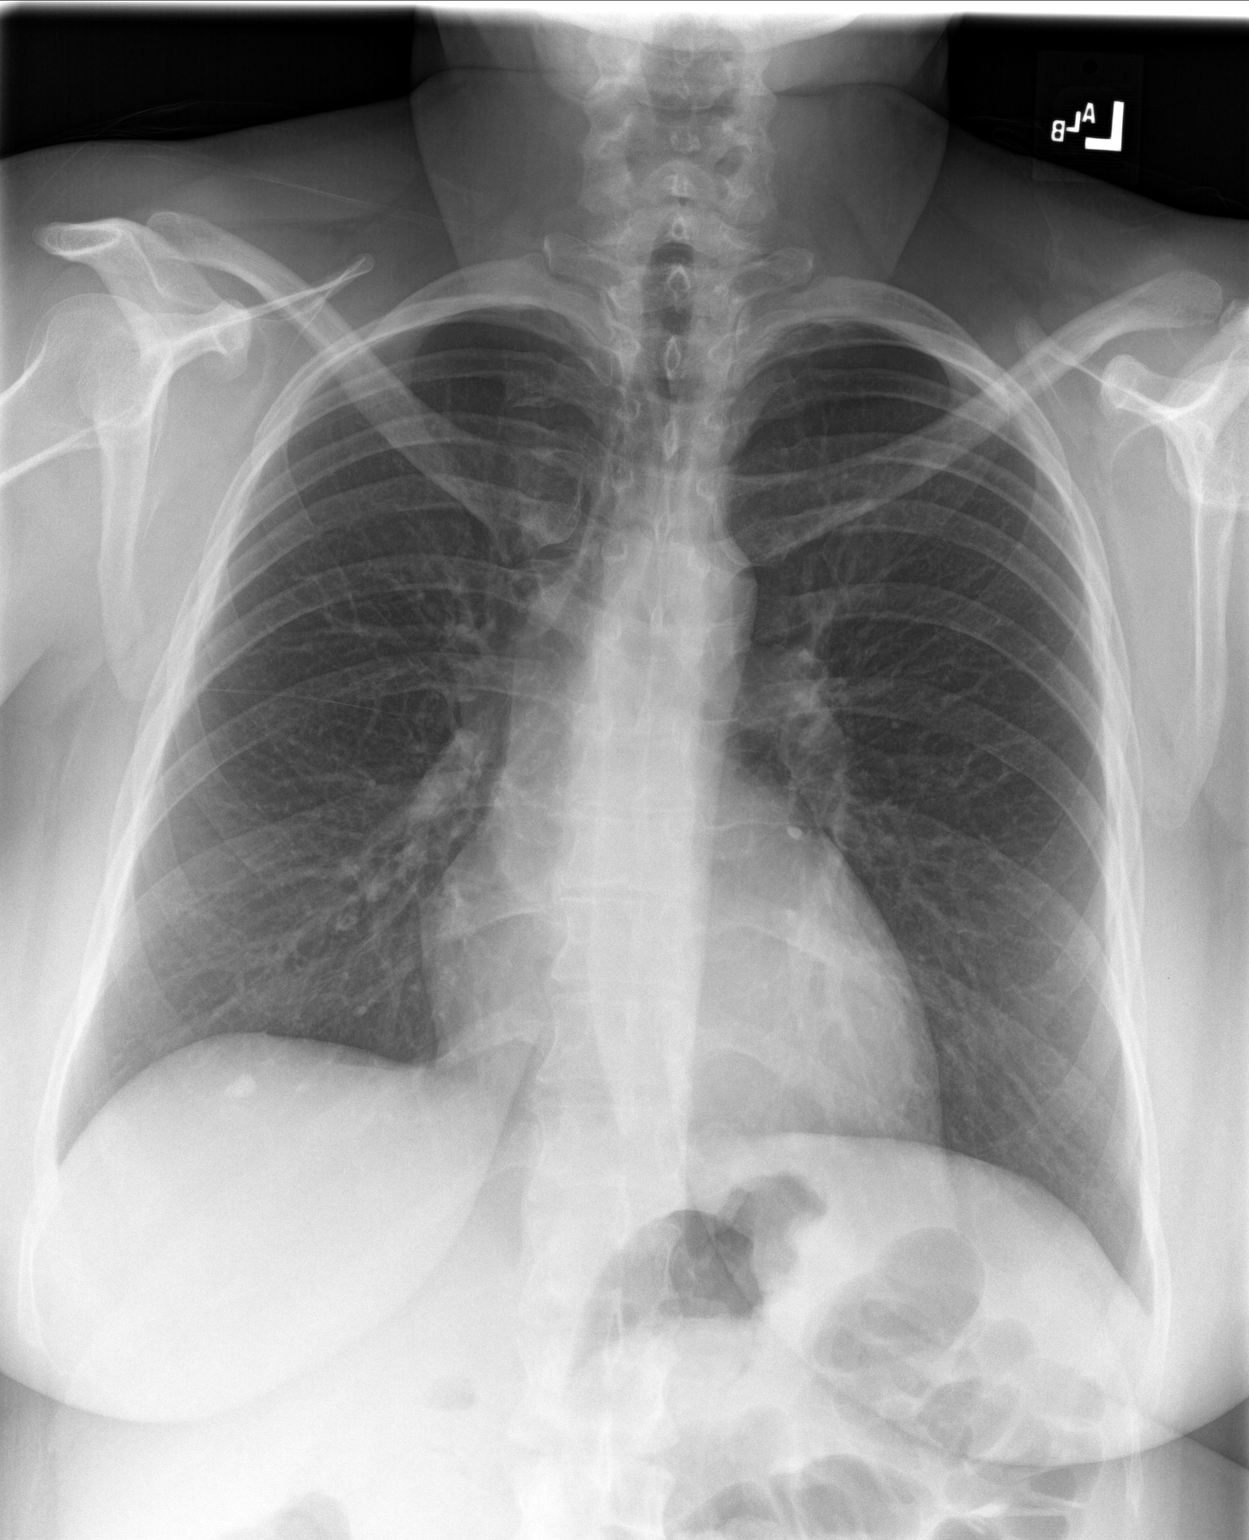

[chest lat]
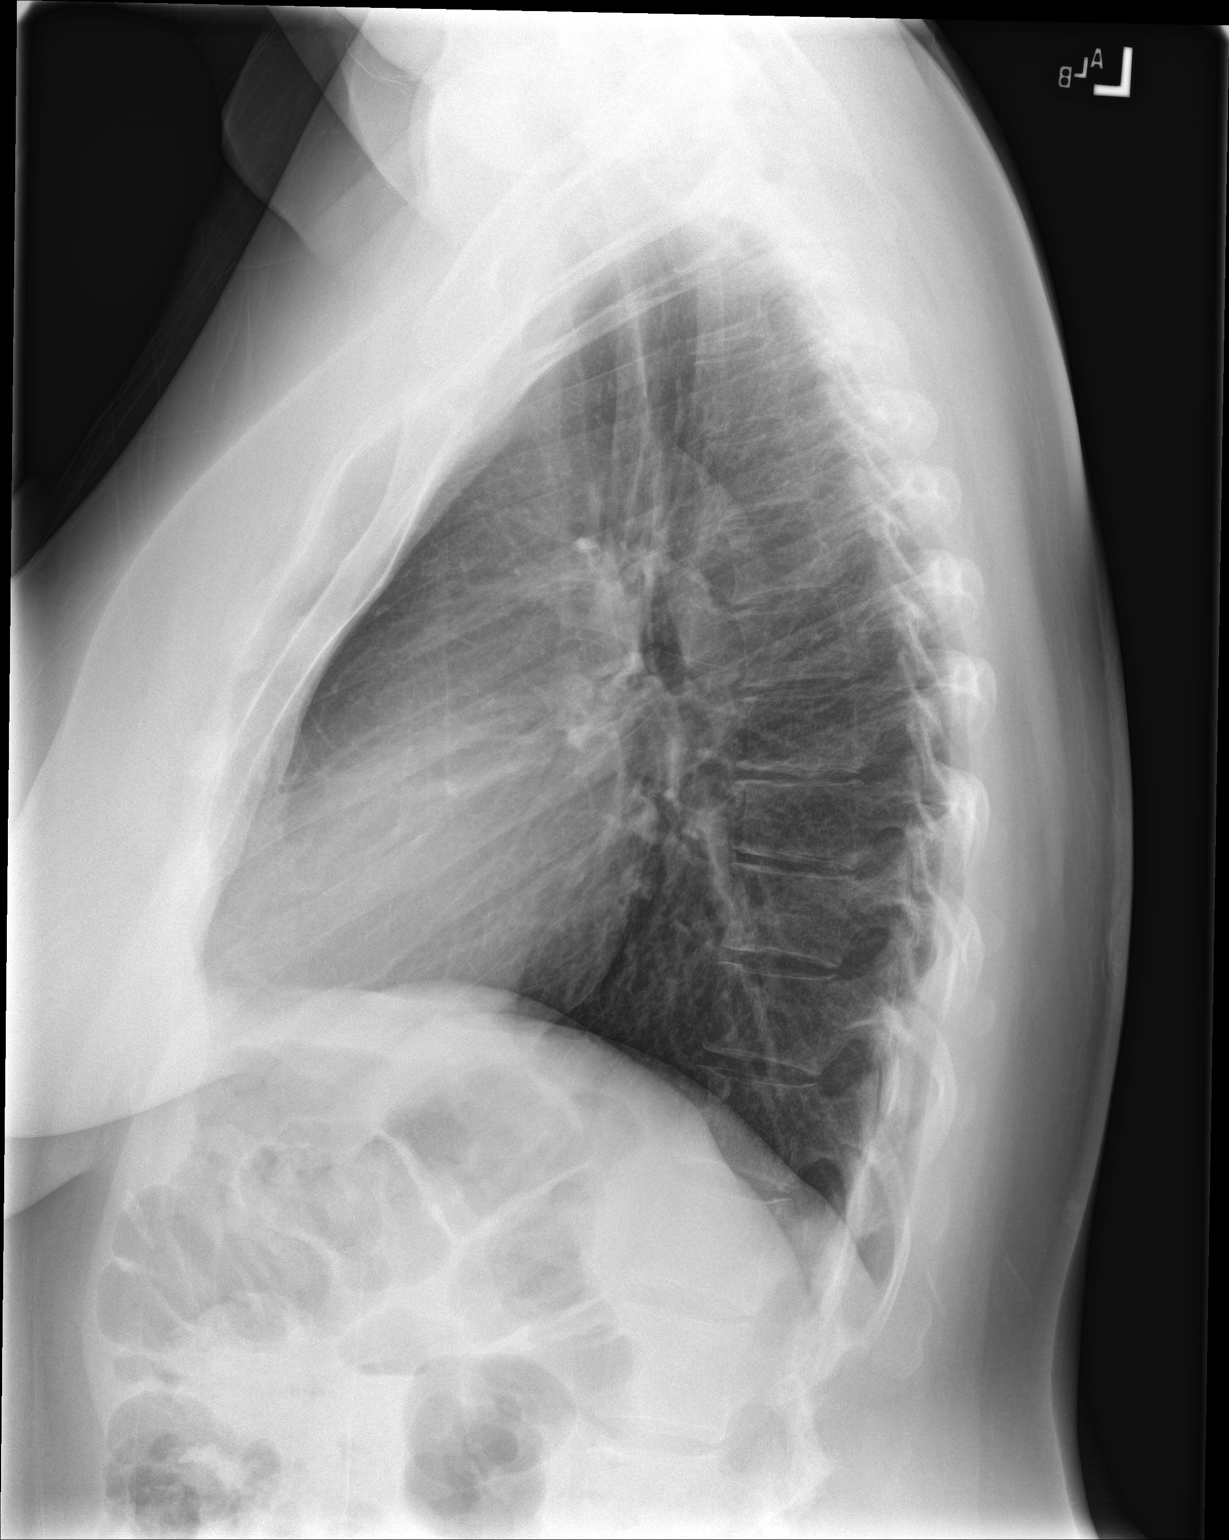

[2 of 2 positions shown; findings below may reference images not displayed]

FINDINGS: The cardiomediastinal silhouette is unremarkable.

There is no evidence of focal airspace disease, pulmonary edema,
suspicious pulmonary nodule/mass, pleural effusion, or pneumothorax.

No acute bony abnormalities are identified.
IMPRESSION: No active cardiopulmonary disease.

## 2023-01-10 ENCOUNTER — Ambulatory Visit
Admission: EM | Admit: 2023-01-10 | Discharge: 2023-01-10 | Disposition: A | Discharge status: Home / Self Care | Payer: 59 | Attending: Family Medicine | Admitting: Family Medicine

## 2023-01-10 DIAGNOSIS — J4521 Mild intermittent asthma with (acute) exacerbation: Secondary | ICD-10-CM | POA: Diagnosis not present

## 2023-01-10 DIAGNOSIS — J029 Acute pharyngitis, unspecified: Secondary | ICD-10-CM | POA: Diagnosis not present

## 2023-01-10 LAB — GROUP A STREP BY PCR: Group A Strep by PCR: NOT DETECTED

## 2023-01-10 MED ORDER — PREDNISONE 10 MG (21) PO TBPK: ORAL_TABLET | Freq: Every day | ORAL | 0 refills | Status: DC

## 2023-01-10 MED ORDER — AZITHROMYCIN 250 MG PO TABS: ORAL_TABLET | ORAL | 0 refills | Status: DC

## 2023-01-10 MED ORDER — PROMETHAZINE-DM 6.25-15 MG/5ML PO SYRP: 5.0000 mL | ORAL_SOLUTION | Freq: Four times a day (QID) | ORAL | 0 refills | Status: DC | PRN

## 2023-01-10 NOTE — Discharge Instructions (Signed)
Stop by the pharmacy to pick up your prescriptions.  Follow up with your primary care provider as needed.  

## 2023-01-10 NOTE — ED Provider Notes (Signed)
[facility]    CSN: [id number] Arrival date & time: 01/10/23  1746      History   Chief Complaint Chief Complaint  Patient presents with   Sore Throat   Otalgia    HPI Nevaen Trama is a 37 y.o. female.   HPI   Malillany presents for sore throat for the past 2 weeks.  She has trouble breathing at night. Has slight cough that is not productive. Has asthma and has been using her inhaler daily.  She has not taken a COVID test.    Fever : no Chills: yes but resolved  Sore throat: yes Cough: yes Sputum: no Chest tightness: yes Shortness of breath: yes Wheezing: no Nasal congestion : no  Rhinorrhea: no Myalgias: resolved Appetite: normal  Hydration: normal  Abdominal pain: no Nausea: no Vomiting: no Diarrhea: No Rash: No Sleep disturbance: no  Headache: yes     Past Medical History:  Diagnosis Date   Asthma     There are no problems to display for this patient.   Past Surgical History:  Procedure Laterality Date   NO PAST SURGERIES      OB History   No obstetric history on file.      Home Medications    Prior to Admission medications   Medication Sig Start Date End Date Taking? Authorizing Provider  albuterol (PROVENTIL HFA;VENTOLIN HFA) 108 (90 BASE) MCG/ACT inhaler Inhale into the lungs every 6 (six) hours as needed for wheezing or shortness of breath.   Yes [provider]  azithromycin (ZITHROMAX Z-PAK) 250 MG tablet Take 2 tablets on day 1 then 1 tablet daily 01/10/23  Yes , , DO  predniSONE (STERAPRED UNI-PAK 21 TAB) 10 MG (21) TBPK tablet Take by mouth daily. Take 6 tabs by mouth daily for 1, then 5 tabs for 1 day, then 4 tabs for 1 day, then 3 tabs for 1 day, then 2 tabs for 1 day, then 1 tab for 1 day. 01/10/23  Yes , , DO  promethazine-dextromethorphan (PROMETHAZINE-DM) 6.25-15 MG/5ML syrup Take 5 mLs by mouth 4 (four) times daily as needed. 01/10/23   Katha Cabal, DO    Family History No  family history on file.  Social History Social History   Tobacco Use   Smoking status: Never   Smokeless tobacco: Never  Vaping Use   Vaping Use: Never used  Substance Use Topics   Alcohol use: No   Drug use: No     Allergies   Patient has no known allergies.   Review of Systems Review of Systems: negative unless otherwise stated in HPI.      Physical Exam Triage Vital Signs ED Triage Vitals  Enc Vitals Group     BP 01/10/23 1828 126/85     Pulse Rate 01/10/23 1828 67     Resp --      Temp 01/10/23 1828 98.6 F (37 C)     Temp Source 01/10/23 1828 Oral     SpO2 01/10/23 1828 95 %     Weight 01/10/23 1828 200 lb (90.7 kg)     Height 01/10/23 1828 5\' 1"  (1.549 m)     Head Circumference --      Peak Flow --      Pain Score 01/10/23 1827 7     Pain Loc --      Pain Edu? --      Excl. in [place]? --    No data found.  Updated Vital Signs  BP 126/85 (BP Location: Left Arm)   Pulse 67   Temp 98.6 F (37 C) (Oral)   Ht 5\' 1"  (1.549 m)   Wt 90.7 kg   LMP 01/08/2023 (Exact Date)   SpO2 95%   BMI 37.79 kg/m   Visual Acuity Right Eye Distance:   Left Eye Distance:   Bilateral Distance:    Right Eye Near:   Left Eye Near:    Bilateral Near:     Physical Exam GEN:     alert, non-toxic appearing female in no distress   HENT:  mucus membranes moist, oropharyngeal without lesions, moderate erythema, left tonsillar hypertrophy or exudates, no nasal discharge, bilateral TM normal EYES:   pupils equal and reactive, no scleral injection or discharge NECK:  normal ROM, left lymphadenopathy, no meningismus   RESP:  no increased work of breathing,, faint expiratory wheezing  CVS:   regular rate and rhythm Skin:   warm and dry, no rash on visible skin    UC Treatments / Results  Labs (all labs ordered are listed, but only abnormal results are displayed) Labs Reviewed  GROUP A STREP BY PCR    EKG   Radiology No results found.  Procedures Procedures  (including critical care time)  Medications Ordered in UC Medications - No data to display  Initial Impression / Assessment and Plan / UC Course  I have reviewed the triage vital signs and the nursing notes.  Pertinent labs & imaging results that were available during my care of the patient were reviewed by me and considered in my medical decision making (see chart for details).       Pt is a 37 y.o. female who presents for 2 weeks of sore throat and cough that is not improving.  Lien is afebrile here without recent antipyretics. Satting 95% on room air. Overall pt is  non-toxic appearing, well hydrated, without respiratory distress. Pulmonary exam is remarkable for frequent cough and faint expiratory wheezing.  After shared decision making, we will not pursue chest x-ray at this time as it currently would not change management.  COVID  and influenza testing deferred due to length of symptoms.   Treat acute asthma bronchitis flare with steroids and antibiotics as below.  Promethazine DM cough syrup given for cough and allow patient to rest.  Typical duration of symptoms discussed. Return and ED precautions given and patient voiced understanding.   Discussed MDM, treatment plan and plan for follow-up with patient who agrees with plan.      Final Clinical Impressions(s) / UC Diagnoses   Final diagnoses:  Pharyngitis, unspecified etiology  Mild intermittent asthmatic bronchitis with acute exacerbation     Discharge Instructions      Stop by the pharmacy to pick up your prescriptions.  Follow up with your primary care provider as needed.      ED Prescriptions     Medication Sig Dispense Auth. Provider   predniSONE (STERAPRED UNI-PAK 21 TAB) 10 MG (21) TBPK tablet Take by mouth daily. Take 6 tabs by mouth daily for 1, then 5 tabs for 1 day, then 4 tabs for 1 day, then 3 tabs for 1 day, then 2 tabs for 1 day, then 1 tab for 1 day. 21 tablet , , DO   azithromycin  (ZITHROMAX Z-PAK) 250 MG tablet Take 2 tablets on day 1 then 1 tablet daily 6 tablet , , DO   promethazine-dextromethorphan (PROMETHAZINE-DM) 6.25-15 MG/5ML syrup Take 5 mLs by mouth 4 (four) times  daily as needed. 118 mL Katha Cabal, DO      PDMP not reviewed this encounter.   Katha Cabal, DO 01/10/23 0102

## 2023-01-10 NOTE — ED Triage Notes (Signed)
Pt c/o sore throat onset x14 days ago. Pt has been taking some allergy pills with no relief. Pt states she has also has been using her albuterol. Pt also reports bilateral ear ache onset today.

## 2023-01-23 ENCOUNTER — Encounter: Payer: Self-pay | Admitting: *Deleted

## 2023-01-23 ENCOUNTER — Other Ambulatory Visit: Payer: Self-pay | Admitting: Student

## 2023-01-23 DIAGNOSIS — Z1231 Encounter for screening mammogram for malignant neoplasm of breast: Secondary | ICD-10-CM

## 2023-01-23 DIAGNOSIS — N63 Unspecified lump in unspecified breast: Secondary | ICD-10-CM

## 2023-01-25 ENCOUNTER — Other Ambulatory Visit: Payer: Self-pay | Admitting: *Deleted

## 2023-01-25 ENCOUNTER — Inpatient Hospital Stay
Admission: RE | Admit: 2023-01-25 | Discharge: 2023-01-25 | Disposition: A | Discharge status: Home / Self Care | Payer: Self-pay | Source: Ambulatory Visit | Attending: Student | Admitting: Student

## 2023-01-25 DIAGNOSIS — Z1231 Encounter for screening mammogram for malignant neoplasm of breast: Secondary | ICD-10-CM

## 2023-02-19 ENCOUNTER — Other Ambulatory Visit: Payer: 59

## 2023-04-02 ENCOUNTER — Ambulatory Visit
Admission: RE | Admit: 2023-04-02 | Discharge: 2023-04-02 | Disposition: A | Discharge status: Home / Self Care | Payer: 59 | Source: Ambulatory Visit | Attending: Student | Admitting: Student

## 2023-04-02 DIAGNOSIS — N63 Unspecified lump in unspecified breast: Secondary | ICD-10-CM | POA: Diagnosis not present

## 2023-04-02 DIAGNOSIS — R92323 Mammographic fibroglandular density, bilateral breasts: Secondary | ICD-10-CM | POA: Diagnosis not present

## 2023-04-02 DIAGNOSIS — N6321 Unspecified lump in the left breast, upper outer quadrant: Secondary | ICD-10-CM | POA: Diagnosis not present

## 2023-04-16 DIAGNOSIS — E785 Hyperlipidemia, unspecified: Secondary | ICD-10-CM | POA: Diagnosis not present

## 2023-04-16 DIAGNOSIS — R682 Dry mouth, unspecified: Secondary | ICD-10-CM | POA: Diagnosis not present

## 2023-04-16 DIAGNOSIS — Z1389 Encounter for screening for other disorder: Secondary | ICD-10-CM | POA: Diagnosis not present

## 2023-04-16 DIAGNOSIS — Z1331 Encounter for screening for depression: Secondary | ICD-10-CM | POA: Diagnosis not present

## 2023-04-22 ENCOUNTER — Ambulatory Visit
Admission: EM | Admit: 2023-04-22 | Discharge: 2023-04-22 | Disposition: A | Discharge status: Home / Self Care | Payer: 59 | Attending: Internal Medicine | Admitting: Internal Medicine

## 2023-04-22 DIAGNOSIS — M5441 Lumbago with sciatica, right side: Secondary | ICD-10-CM | POA: Insufficient documentation

## 2023-04-22 DIAGNOSIS — M461 Sacroiliitis, not elsewhere classified: Secondary | ICD-10-CM | POA: Insufficient documentation

## 2023-04-22 LAB — URINALYSIS, W/ REFLEX TO CULTURE (INFECTION SUSPECTED)
Bilirubin Urine: NEGATIVE
Glucose, UA: NEGATIVE mg/dL
Ketones, ur: NEGATIVE mg/dL
Leukocytes,Ua: NEGATIVE
Nitrite: NEGATIVE
Protein, ur: NEGATIVE mg/dL
Specific Gravity, Urine: 1.015 (ref 1.005–1.030)
pH: 7.5 (ref 5.0–8.0)

## 2023-04-22 MED ORDER — METHYLPREDNISOLONE 4 MG PO TBPK: ORAL_TABLET | ORAL | 0 refills | Status: DC

## 2023-04-22 MED ORDER — METHYLPREDNISOLONE ACETATE 40 MG/ML IJ SUSP
40.0000 mg | Freq: Once | INTRAMUSCULAR | Status: AC
  Administered 2023-04-22: 40 mg via INTRAMUSCULAR

## 2023-04-22 MED ORDER — CYCLOBENZAPRINE HCL 10 MG PO TABS: 10.0000 mg | ORAL_TABLET | Freq: Two times a day (BID) | ORAL | 0 refills | Status: DC | PRN

## 2023-04-22 MED ORDER — SULFAMETHOXAZOLE-TRIMETHOPRIM 800-160 MG PO TABS: 1.0000 | ORAL_TABLET | Freq: Two times a day (BID) | ORAL | 0 refills | Status: AC

## 2023-04-22 NOTE — ED Triage Notes (Signed)
Sx started 3 weeks. Lower back pain. Went to dr on Monday that gave her patches that aren't helping. Sore after she pee's.

## 2023-04-22 NOTE — ED Provider Notes (Signed)
[facility]    CSN: [id number] Arrival date & time: 04/22/23  1324      History   Chief Complaint Chief Complaint  Patient presents with   Back Pain    HPI Melissa Ballard is a 37 y.o. female.   37 year old female who presents to urgent care with a 3 to 4-week history of lower back pain.  She denies any specific injury.  The pain has gradually worsened.  She does have a history of this in the past but usually it only last a couple of days.  The pain is aching with stiffness along the lumbar region with occasional pain that shoots down the right thigh.  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    She also reports that she has had a several month history of pain after urination.  This does not occur every time but is relatively frequent.  She also has frequency.  She does drink large amounts of caffeinated coffee per day.  She has also been on multiple antibiotics for urinary tract infections in the past.  She denies any hematuria or other urinary symptoms.    Back Pain Location:  Sacro-iliac joint and lumbar spine Quality:  Aching, shooting and stiffness Radiates to:  R thigh Pain severity:  Mild Onset quality:  Gradual Associated symptoms: dysuria   Associated symptoms: no abdominal pain, no chest pain, no fever and no pelvic pain     Past Medical History:  Diagnosis Date   Asthma     There are no problems to display for this patient.   Past Surgical History:  Procedure Laterality Date   NO PAST SURGERIES      OB History   No obstetric history on file.      Home Medications    Prior to Admission medications   Medication Sig Start Date End Date Taking? Authorizing Provider  albuterol (PROVENTIL HFA;VENTOLIN HFA) 108 (90 BASE) MCG/ACT inhaler Inhale into the lungs every 6 (six) hours as needed for wheezing or shortness of breath.    [provider]  azithromycin (ZITHROMAX Z-PAK) 250 MG tablet Take 2  tablets on day 1 then 1 tablet daily 01/10/23   Brimage, Vondra, DO  predniSONE (STERAPRED UNI-PAK 21 TAB) 10 MG (21) TBPK tablet Take by mouth daily. Take 6 tabs by mouth daily for 1, then 5 tabs for 1 day, then 4 tabs for 1 day, then 3 tabs for 1 day, then 2 tabs for 1 day, then 1 tab for 1 day. 01/10/23   Katha Cabal, DO  promethazine-dextromethorphan (PROMETHAZINE-DM) 6.25-15 MG/5ML syrup Take 5 mLs by mouth 4 (four) times daily as needed. 01/10/23   Katha Cabal, DO    Family History History reviewed. No pertinent family history.  Social History Social History   Tobacco Use   Smoking status: Never   Smokeless tobacco: Never  Vaping Use   Vaping status: Never Used  Substance Use Topics   Alcohol use: No   Drug use: No     Allergies   Patient has no known allergies.   Review of Systems Review of Systems  Constitutional:  Negative for chills and fever.  HENT:  Negative for ear pain and sore throat.   Eyes:  Negative for pain and visual disturbance.  Respiratory:  Negative for cough and shortness of breath.   Cardiovascular:  Negative for chest pain and palpitations.  Gastrointestinal:  Negative for abdominal pain and vomiting.  Genitourinary:  Positive for dysuria and frequency. Negative for decreased urine volume, difficulty urinating, hematuria, pelvic pain and urgency.  Musculoskeletal:  Positive for back pain. Negative for arthralgias.  Skin:  Negative for color change and rash.  Neurological:  Negative for seizures and syncope.  All other systems reviewed and are negative.    Physical Exam Triage Vital Signs ED Triage Vitals  Encounter Vitals Group     BP 04/22/23 0853 121/62     Systolic BP Percentile --      Diastolic BP Percentile --      Pulse Rate 04/22/23 0853 (!) 59     Resp 04/22/23 0853 17     Temp 04/22/23 0853 98.4 F (36.9 C)     Temp Source 04/22/23 0853 Oral     SpO2 04/22/23 0853 100 %     Weight 04/22/23 0851 205 lb (93 kg)     Height  --      Head Circumference --      Peak Flow --      Pain Score 04/22/23 0851 6     Pain Loc --      Pain Education --      Exclude from Growth Chart --    No data found.  Updated Vital Signs BP 121/62 (BP Location: Left Arm)   Pulse (!) 59   Temp 98.4 F (36.9 C) (Oral)   Resp 17   Wt 205 lb (93 kg)   LMP 04/02/2023   SpO2 100%   BMI 38.73 kg/m   Visual Acuity Right Eye Distance:   Left Eye Distance:   Bilateral Distance:    Right Eye Near:   Left Eye Near:    Bilateral Near:     Physical Exam Vitals and nursing note reviewed.  Constitutional:      General: She is not in acute distress.    Appearance: She is well-developed.  HENT:     Head: Normocephalic and atraumatic.  Eyes:     Conjunctiva/sclera: Conjunctivae normal.  Cardiovascular:     Rate and Rhythm: Normal rate and regular rhythm.     Heart sounds: No murmur heard. Pulmonary:     Effort: Pulmonary effort is normal. No respiratory distress.     Breath sounds: Normal breath sounds.  Abdominal:     Palpations: Abdomen is soft.     Tenderness: There is no abdominal tenderness.  Musculoskeletal:        General: Tenderness (Lumbar region on bilateral.) present. No swelling. Normal range of motion.     Cervical back: Neck supple.     Comments: No evidence of scoliosis  Skin:    General: Skin is warm and dry.     Capillary Refill: Capillary refill takes less than 2 seconds.  Neurological:     Mental Status: She is alert.  Psychiatric:        Mood and Affect: Mood normal.      UC Treatments / Results  Labs (all labs ordered are listed, but only abnormal results are displayed) Labs Reviewed  URINALYSIS, W/ REFLEX TO CULTURE (INFECTION SUSPECTED)    EKG   Radiology No results found.  Procedures Procedures (including critical care time)  Medications Ordered in UC Medications - No data to display  Initial Impression / Assessment and Plan / UC Course  I have reviewed the triage vital  signs and the nursing notes.  Pertinent labs & imaging results that were available during my care of the patient were reviewed  by me and considered in my medical decision making (see chart for details).  Clinical Course as of 04/22/23 6578  Wynelle Link Apr 22, 2023  0907 Urinalysis, w/ Reflex to Culture (Infection Suspected) -Urine, Clean Catch [EW]    Clinical Course User Index [EW] Landis Martins, PA-C    Back Pain: likely SI joint inflammation with mild right sciatica.  Medrol 40 mg injection done today.  Will have the patient's start Medrol Dosepak tomorrow 8/26.  Advised the patient that she should stretch before any lifting and when first getting up in the morning.  She can also use Flexeril twice daily as needed for muscle spasms.  If symptoms persist she may need to see an orthospine specialist..  UTI with pain after urination: start bactrim twice daily for 5 days. Stay hydration with water. Avoid large amounts of caffeine.  Return to urgent care if symptoms worsen or fail to improve. Final Clinical Impressions(s) / UC Diagnoses   Final diagnoses:  None   Discharge Instructions   None    ED Prescriptions   None    PDMP not reviewed this encounter.   Landis Martins, [place] 04/22/23 [phone]

## 2023-04-22 NOTE — Discharge Instructions (Addendum)
Back Pain: likely SI joint inflammation, start steroid dose pack tomorrow 8/26. Make sure to stretch before any lifting and when first getting up in the AM. May use flexeril for muscle spasms. If symptoms persist then may need to see ortho spine.  UTI with pain after urination: start bactrim twice daily for 5 days. Stay hydration with water. Avoid large amounts of caffeine.

## 2023-05-30 DIAGNOSIS — S39012A Strain of muscle, fascia and tendon of lower back, initial encounter: Secondary | ICD-10-CM | POA: Diagnosis not present

## 2023-07-06 DIAGNOSIS — M5451 Vertebrogenic low back pain: Secondary | ICD-10-CM | POA: Diagnosis not present

## 2023-07-06 DIAGNOSIS — M6281 Muscle weakness (generalized): Secondary | ICD-10-CM | POA: Diagnosis not present

## 2023-07-06 DIAGNOSIS — S39012D Strain of muscle, fascia and tendon of lower back, subsequent encounter: Secondary | ICD-10-CM | POA: Diagnosis not present

## 2023-07-09 DIAGNOSIS — M6281 Muscle weakness (generalized): Secondary | ICD-10-CM | POA: Diagnosis not present

## 2023-07-09 DIAGNOSIS — M5451 Vertebrogenic low back pain: Secondary | ICD-10-CM | POA: Diagnosis not present

## 2023-07-27 ENCOUNTER — Encounter: Payer: Self-pay | Admitting: Emergency Medicine

## 2023-07-27 ENCOUNTER — Ambulatory Visit
Admission: EM | Admit: 2023-07-27 | Discharge: 2023-07-27 | Disposition: A | Discharge status: Home / Self Care | Payer: 59 | Attending: Emergency Medicine | Admitting: Emergency Medicine

## 2023-07-27 DIAGNOSIS — J4521 Mild intermittent asthma with (acute) exacerbation: Secondary | ICD-10-CM

## 2023-07-27 MED ORDER — PREDNISONE 20 MG PO TABS: 60.0000 mg | ORAL_TABLET | Freq: Every day | ORAL | 0 refills | Status: DC

## 2023-07-27 MED ORDER — BENZONATATE 100 MG PO CAPS: 200.0000 mg | ORAL_CAPSULE | Freq: Three times a day (TID) | ORAL | 0 refills | Status: DC

## 2023-07-27 MED ORDER — DEXAMETHASONE SODIUM PHOSPHATE 10 MG/ML IJ SOLN
10.0000 mg | Freq: Once | INTRAMUSCULAR | Status: AC
  Administered 2023-07-27: 10 mg via INTRAMUSCULAR

## 2023-07-27 NOTE — ED Provider Notes (Signed)
[facility]    CSN: [id number] Arrival date & time: 07/27/23  6578      History   Chief Complaint Chief Complaint  Patient presents with   Shortness of Breath    HPI Melissa Ballard is a 37 y.o. female.   HPI  37 year old female with a past medical history significant for asthma presents for evaluation of 2 weeks worth of nonproductive cough, shortness of breath, and chest congestion.  She reports that she does wheeze at night.  She denies any fever or upper respiratory symptoms.  She has been using her albuterol inhaler more than normal.  Past Medical History:  Diagnosis Date   Asthma     There are no problems to display for this patient.   Past Surgical History:  Procedure Laterality Date   NO PAST SURGERIES      OB History   No obstetric history on file.      Home Medications    Prior to Admission medications   Medication Sig Start Date End Date Taking? Authorizing Provider  benzonatate (TESSALON) 100 MG capsule Take 2 capsules (200 mg total) by mouth every 8 (eight) hours. 07/27/23  Yes Becky Augusta, NP  predniSONE (DELTASONE) 20 MG tablet Take 3 tablets (60 mg total) by mouth daily with breakfast for 5 days. 3 tablets daily for 5 days. 07/27/23 08/01/23 Yes Becky Augusta, NP  albuterol (PROVENTIL HFA;VENTOLIN HFA) 108 (90 BASE) MCG/ACT inhaler Inhale into the lungs every 6 (six) hours as needed for wheezing or shortness of breath.    [provider]  cyclobenzaprine (FLEXERIL) 10 MG tablet Take 1 tablet (10 mg total) by mouth 2 (two) times daily as needed for muscle spasms. 04/22/23   Landis Martins, PA-C    Family History History reviewed. No pertinent family history.  Social History Social History   Tobacco Use   Smoking status: Never   Smokeless tobacco: Never  Vaping Use   Vaping status: Never Used  Substance Use Topics   Alcohol use: No   Drug use: No     Allergies   Patient has no known allergies.   Review of  Systems Review of Systems  Constitutional:  Negative for fever.  HENT:  Negative for congestion, rhinorrhea and sore throat.   Respiratory:  Positive for cough, shortness of breath and wheezing.      Physical Exam Triage Vital Signs ED Triage Vitals  Encounter Vitals Group     BP 07/27/23 1032 (!) 132/58     Systolic BP Percentile --      Diastolic BP Percentile --      Pulse Rate 07/27/23 1032 (!) 57     Resp 07/27/23 1032 18     Temp 07/27/23 1032 98.4 F (36.9 C)     Temp Source 07/27/23 1032 Oral     SpO2 07/27/23 1032 100 %     Weight --      Height --      Head Circumference --      Peak Flow --      Pain Score 07/27/23 1031 0     Pain Loc --      Pain Education --      Exclude from Growth Chart --    No data found.  Updated Vital Signs BP (!) 132/58 (BP Location: Left Arm)   Pulse (!) 57   Temp 98.4 F (36.9 C) (Oral)   Resp 18   LMP 07/27/2023   SpO2 100%  Visual Acuity Right Eye Distance:   Left Eye Distance:   Bilateral Distance:    Right Eye Near:   Left Eye Near:    Bilateral Near:     Physical Exam Vitals and nursing note reviewed.  Constitutional:      Appearance: Normal appearance. She is not ill-appearing.  HENT:     Head: Normocephalic and atraumatic.  Cardiovascular:     Rate and Rhythm: Normal rate and regular rhythm.     Pulses: Normal pulses.     Heart sounds: Normal heart sounds. No murmur heard.    No friction rub. No gallop.  Pulmonary:     Effort: Pulmonary effort is normal.     Breath sounds: Normal breath sounds. No wheezing, rhonchi or rales.  Skin:    General: Skin is warm and dry.     Capillary Refill: Capillary refill takes less than 2 seconds.     Findings: No rash.  Neurological:     General: No focal deficit present.     Mental Status: She is alert and oriented to person, place, and time.      UC Treatments / Results  Labs (all labs ordered are listed, but only abnormal results are displayed) Labs  Reviewed - No data to display  EKG   Radiology No results found.  Procedures Procedures (including critical care time)  Medications Ordered in UC Medications  dexamethasone (DECADRON) injection 10 mg (has no administration in time range)    Initial Impression / Assessment and Plan / UC Course  I have reviewed the triage vital signs and the nursing notes.  Pertinent labs & imaging results that were available during my care of the patient were reviewed by me and considered in my medical decision making (see chart for details).   Patient is a pleasant, nontoxic-appearing 37 year old female presenting for evaluation of 2 weeks worth of worsening respiratory symptoms as outlined HPI above.  She is able to speak and presents without dyspnea or tachypnea and her room air oxygen saturation is 100% with 18 respiratory rate at triage.  Her lungs are clear to auscultation and she has no evidence of increased work of breathing.  I suspect this is most likely a mild asthma exacerbation given her lack of upper respiratory symptoms.  Her cough has been nonproductive so I do not feel a chest x-ray is ordered at this time.  I will treat her for an asthma exacerbation with 5-day burst dose of prednisone 60 mg and have her continue using her albuterol inhaler, 1 to 2 puffs every 4-6 hours as needed.  Additionally, I will prescribe Tessalon Perles to help her with her cough.  Given the late hour of the day I will have staff administer 10 mg of Decadron here in clinic and she can start the prednisone tomorrow morning.   Final Clinical Impressions(s) / UC Diagnoses   Final diagnoses:  Mild intermittent asthma with exacerbation     Discharge Instructions      Use the albuterol inhaler with a spacer, 1 to 2 puffs every 4-6 hours, as needed for filial lung fullness, wheezing, or shortness of breath.  Take the prednisone 60 mg daily with food for the next 5 days.  This will decrease lung inflammation and  hopefully help your symptoms.  It should also up with your fatigue.  If your symptoms continue, or they worsen, please return for reevaluation or see your primary care provider.      ED Prescriptions  Medication Sig Dispense Auth. Provider   predniSONE (DELTASONE) 20 MG tablet Take 3 tablets (60 mg total) by mouth daily with breakfast for 5 days. 3 tablets daily for 5 days. 15 tablet Becky Augusta, NP   benzonatate (TESSALON) 100 MG capsule Take 2 capsules (200 mg total) by mouth every 8 (eight) hours. 21 capsule Becky Augusta, NP      PDMP not reviewed this encounter.   Becky Augusta, NP 07/27/23 1050

## 2023-07-27 NOTE — Discharge Instructions (Addendum)
Use the albuterol inhaler with a spacer, 1 to 2 puffs every 4-6 hours, as needed for filial lung fullness, wheezing, or shortness of breath.  Take the prednisone 60 mg daily with food for the next 5 days.  This will decrease lung inflammation and hopefully help your symptoms.  It should also up with your fatigue.  If your symptoms continue, or they worsen, please return for reevaluation or see your primary care provider.  

## 2023-07-27 NOTE — ED Triage Notes (Signed)
Pt c/o SOB, some cough and chest congestion x 1 week. Pt states she has asthma an has been using her inhaler more than normal.

## 2023-08-01 ENCOUNTER — Ambulatory Visit: Payer: 59

## 2023-08-01 ENCOUNTER — Ambulatory Visit
Admission: EM | Admit: 2023-08-01 | Discharge: 2023-08-01 | Disposition: A | Discharge status: Home / Self Care | Payer: 59 | Attending: Emergency Medicine | Admitting: Emergency Medicine

## 2023-08-01 DIAGNOSIS — Z760 Encounter for issue of repeat prescription: Secondary | ICD-10-CM

## 2023-08-01 DIAGNOSIS — J4531 Mild persistent asthma with (acute) exacerbation: Secondary | ICD-10-CM | POA: Diagnosis not present

## 2023-08-01 MED ORDER — ALBUTEROL SULFATE HFA 108 (90 BASE) MCG/ACT IN AERS: 2.0000 | INHALATION_SPRAY | RESPIRATORY_TRACT | 0 refills | Status: AC | PRN

## 2023-08-01 MED ORDER — ALBUTEROL SULFATE (2.5 MG/3ML) 0.083% IN NEBU
2.5000 mg | INHALATION_SOLUTION | Freq: Once | RESPIRATORY_TRACT | Status: AC
  Administered 2023-08-01: 2.5 mg via RESPIRATORY_TRACT

## 2023-08-01 MED ORDER — FAMOTIDINE 20 MG PO TABS: 20.0000 mg | ORAL_TABLET | Freq: Two times a day (BID) | ORAL | 0 refills | Status: AC

## 2023-08-01 MED ORDER — IPRATROPIUM-ALBUTEROL 0.5-2.5 (3) MG/3ML IN SOLN
3.0000 mL | Freq: Once | RESPIRATORY_TRACT | Status: AC
  Administered 2023-08-01: 3 mL via RESPIRATORY_TRACT

## 2023-08-01 MED ORDER — AEROCHAMBER MV MISC: 1 refills | Status: AC

## 2023-08-01 MED ORDER — BUDESONIDE-FORMOTEROL FUMARATE 80-4.5 MCG/ACT IN AERO: 2.0000 | INHALATION_SPRAY | Freq: Two times a day (BID) | RESPIRATORY_TRACT | 1 refills | Status: AC

## 2023-08-01 NOTE — ED Triage Notes (Signed)
Pt states she was seen last Friday for cough, pt states today was last dose of medicine and not better pt states she feels worse, SOB, pt states she does have hx of asthma.

## 2023-08-01 NOTE — Discharge Instructions (Addendum)
Take two puffs from your albuterol inhaler every 4 hours for 2 days, then every 6 hours for 2 days, then as needed. You can back off if you start to fimprove  sooner.  Start the Symbicort.  This is a long-acting bronchodilator and we will put the steroids where the problem is.  You may decrease the frequency of your albuterol inhaler as the numbers go up and you start feeling better. Make sure you drink extra fluids. Return if you get worse, have a fever >100.4, or any other concerns.   Cough can also be caused by acid reflux.  I would back off on the coffee for now, elevate head of the bed to 30 degrees and you can try some Pepcid.  If the spacer is too expensive at the pharmacy, you can get an AeroChamber Z-Stat off of [employer] for about $10-$15.  Go to www.goodrx.com  or www.costplusdrugs.com to look up your medications. This will give you a list of where you can find your prescriptions at the most affordable prices. Or ask the pharmacist what the cash price is, or if they have any other discount programs available to help make your medication more affordable. This can be less expensive than what you would pay with insurance.

## 2023-08-01 NOTE — ED Provider Notes (Signed)
HPI  SUBJECTIVE:  Melissa Ballard is a 37 y.o. female who presents with 3 weeks of a dry cough, substernal chest tightness, dyspnea on exertion, sore throat from the cough and shortness of breath.  She states that she is coughing all night long.  No fevers, nasal congestion, rhinorrhea, sinus pain or pressure, postnasal drip, wheezing, GERD symptoms.  She has been using her albuterol every 4 hours and finished prednisone today from her previous visit with partial relief of her symptoms.  No aggravating factors.  No antibiotics in the past 3 months.  She has a past medical history of asthma and has a remote admission.  No intubations.  No recent travel/immobilization, surgery in the past 4 weeks, hemoptysis, calf pain or swelling or OCP use.  No history of GERD, allergies, DVT, PE.  LMP: 2 days ago.  Denies the possibility of being pregnant.  PCP: Bernestine Amass.  Patient was seen here on 11/29 for 2 weeks of cough, shortness of breath, chest congestion, wheezing at night.  She was thought to have an asthma exacerbation, was given sent home with 5-day prednisone taper, albuterol inhaler as needed, Tessalon Perles.  She was given 10 mg of Decadron here.  States this helped, but did not completely resolve her symptoms.  Past Medical History:  Diagnosis Date   Asthma     Past Surgical History:  Procedure Laterality Date   NO PAST SURGERIES      History reviewed. No pertinent family history.  Social History   Tobacco Use   Smoking status: Never   Smokeless tobacco: Never  Vaping Use   Vaping status: Never Used  Substance Use Topics   Alcohol use: No   Drug use: No    No current facility-administered medications for this encounter.  Current Outpatient Medications:    albuterol (VENTOLIN HFA) 108 (90 Base) MCG/ACT inhaler, Inhale 2 puffs into the lungs every 4 (four) hours as needed for wheezing or shortness of breath., Disp: 1 each, Rfl: 0   budesonide-formoterol (SYMBICORT) 80-4.5  MCG/ACT inhaler, Inhale 2 puffs into the lungs 2 (two) times daily., Disp: 1 each, Rfl: 1   famotidine (PEPCID) 20 MG tablet, Take 1 tablet (20 mg total) by mouth 2 (two) times daily., Disp: 40 tablet, Rfl: 0   Spacer/Aero-Holding Chambers (AEROCHAMBER MV) inhaler, Use as instructed, Disp: 1 each, Rfl: 1  No Known Allergies   ROS  As noted in HPI.   Physical Exam  BP 127/80 (BP Location: Left Arm)   Pulse 65   Temp 98.3 F (36.8 C) (Oral)   Ht 5\' 1"  (1.549 m)   Wt 90.7 kg   LMP 07/27/2023   SpO2 100%   BMI 37.79 kg/m   Constitutional: Well developed, well nourished, no acute distress.  Dry cough. Eyes:  EOMI, conjunctiva normal bilaterally HENT: Normocephalic, atraumatic,mucus membranes moist.  No nasal congestion.  No sinus tenderness.  No postnasal drip. Respiratory: Normal inspiratory effort, fair air movement.  Positive anterior, lateral chest wall tenderness.  Lungs clear bilaterally Cardiovascular: Normal rate, regular rhythm, no murmurs rubs or gallops GI: nondistended skin: No rash, skin intact Musculoskeletal: Calves symmetric, nontender, no edema. Neurologic: Alert & oriented x 3, no focal neuro deficits Psychiatric: Speech and behavior appropriate   ED Course   Medications  albuterol (PROVENTIL) (2.5 MG/3ML) 0.083% nebulizer solution 2.5 mg (2.5 mg Nebulization Given 08/01/23 1824)  ipratropium-albuterol (DUONEB) 0.5-2.5 (3) MG/3ML nebulizer solution 3 mL (3 mLs Nebulization Given 08/01/23 1824)    Orders  Placed This Encounter  Procedures   DG Chest 2 View    Standing Status:   Standing    Number of Occurrences:   1    Order Specific Question:   Reason for Exam (SYMPTOM  OR DIAGNOSIS REQUIRED)    Answer:   Cough 3 weeks rule out pneumonia    No results found for this or any previous visit (from the past 24 hour(s)). DG Chest 2 View  Result Date: 08/01/2023 CLINICAL DATA:  Cough x3 weeks. EXAM: CHEST - 2 VIEW COMPARISON:  February 14, 2021 FINDINGS: The  heart size and mediastinal contours are within normal limits. A stable subcentimeter calcified lung nodule is seen within the posterior aspect of the right lung base. There is no evidence of acute infiltrate, pleural effusion or pneumothorax. The visualized skeletal structures are unremarkable. IMPRESSION: No active cardiopulmonary disease. Electronically Signed   By: Aram Candela M.D.   On: 08/01/2023 19:31    ED Clinical Impression  1. Mild persistent asthma with exacerbation   2. Medication refill      ED Assessment/Plan     Previous records reviewed.  As noted in HPI.  Will check chest x-ray given duration of symptoms and that this is her second visit.  Will give DuoNeb 5/0.5 as she has been using her albuterol inhaler every 4 hours without much improvement. she finished prednisone yesterday.  Patient PERC negative.  Reviewed imaging independently.  No consolidation, effusion.  Rounded opacity right lower lobe that was present in previous x-ray from 2022, unchanged.  Discussed this with patient, advised her to keep an eye on this.  Formal radiology overread pending.  Will contact patient if overread differs enough from mine and we need to change management.  See radiology report for full details.  Reviewed radiology report.  Stable calcified lung nodule in the right lung base.  No pneumonia, consistent with my read.  See radiology report for details.  On reevaluation, patient states she feels significantly better.  Improved air movement.  Lungs still clear.  Discussed differential of cough, she may also have a component of GERD with this.  Advised elevating head of the bed, she can try some Pepcid.  Will send home with Symbicort, regularly scheduled albuterol inhaler with a spacer for 4 days, then as needed thereafter and refill her albuterol nebulizer.  Follow-up with PCP as needed.  Discussed imaging, MDM, treatment plan, and plan for follow-up with patient. . patient agrees with  plan.   Meds ordered this encounter  Medications   albuterol (PROVENTIL) (2.5 MG/3ML) 0.083% nebulizer solution 2.5 mg   ipratropium-albuterol (DUONEB) 0.5-2.5 (3) MG/3ML nebulizer solution 3 mL   budesonide-formoterol (SYMBICORT) 80-4.5 MCG/ACT inhaler    Sig: Inhale 2 puffs into the lungs 2 (two) times daily.    Dispense:  1 each    Refill:  1   albuterol (VENTOLIN HFA) 108 (90 Base) MCG/ACT inhaler    Sig: Inhale 2 puffs into the lungs every 4 (four) hours as needed for wheezing or shortness of breath.    Dispense:  1 each    Refill:  0   Spacer/Aero-Holding Chambers (AEROCHAMBER MV) inhaler    Sig: Use as instructed    Dispense:  1 each    Refill:  1   famotidine (PEPCID) 20 MG tablet    Sig: Take 1 tablet (20 mg total) by mouth 2 (two) times daily.    Dispense:  40 tablet    Refill:  0      *  This clinic note was created using Scientist, clinical (histocompatibility and immunogenetics). Therefore, there may be occasional mistakes despite careful proofreading.  ?    Domenick Gong, MD 08/02/23 [phone]
